# Patient Record
Sex: Female | Born: 1950 | Race: Black or African American | Hispanic: No | Smoking: Current every day smoker
Health system: Southern US, Community
[De-identification: ages and names within clinical notes are randomized; demographics above are authoritative.]

## PROBLEM LIST (undated history)

## (undated) DIAGNOSIS — E119 Type 2 diabetes mellitus without complications: Secondary | ICD-10-CM

## (undated) DIAGNOSIS — I10 Essential (primary) hypertension: Secondary | ICD-10-CM

## (undated) HISTORY — PX: ABDOMINAL HYSTERECTOMY: SHX81

---

## 1998-09-01 ENCOUNTER — Ambulatory Visit (HOSPITAL_COMMUNITY): Admission: RE | Admit: 1998-09-01 | Discharge: 1998-09-01 | Payer: Self-pay | Admitting: Family Medicine

## 1998-09-01 ENCOUNTER — Encounter: Payer: Self-pay | Admitting: Family Medicine

## 1998-09-21 ENCOUNTER — Other Ambulatory Visit: Admission: RE | Admit: 1998-09-21 | Discharge: 1998-09-21 | Payer: Self-pay | Admitting: Family Medicine

## 2000-01-11 ENCOUNTER — Emergency Department (HOSPITAL_COMMUNITY): Admission: EM | Admit: 2000-01-11 | Discharge: 2000-01-11 | Payer: Self-pay | Admitting: Emergency Medicine

## 2000-01-11 ENCOUNTER — Encounter: Payer: Self-pay | Admitting: Emergency Medicine

## 2001-09-15 ENCOUNTER — Encounter: Admission: RE | Admit: 2001-09-15 | Discharge: 2001-12-14 | Payer: Self-pay | Admitting: *Deleted

## 2002-04-19 ENCOUNTER — Encounter: Payer: Self-pay | Admitting: Emergency Medicine

## 2002-04-19 ENCOUNTER — Emergency Department (HOSPITAL_COMMUNITY): Admission: EM | Admit: 2002-04-19 | Discharge: 2002-04-19 | Payer: Self-pay | Admitting: Emergency Medicine

## 2003-06-04 ENCOUNTER — Inpatient Hospital Stay (HOSPITAL_COMMUNITY): Admission: EM | Admit: 2003-06-04 | Discharge: 2003-06-05 | Payer: Self-pay | Admitting: Emergency Medicine

## 2004-09-26 ENCOUNTER — Emergency Department (HOSPITAL_COMMUNITY): Admission: EM | Admit: 2004-09-26 | Discharge: 2004-09-26 | Payer: Self-pay | Admitting: Emergency Medicine

## 2004-10-17 ENCOUNTER — Encounter: Admission: RE | Admit: 2004-10-17 | Discharge: 2004-11-20 | Payer: Self-pay | Admitting: Occupational Medicine

## 2007-07-18 ENCOUNTER — Emergency Department (HOSPITAL_COMMUNITY): Admission: EM | Admit: 2007-07-18 | Discharge: 2007-07-18 | Payer: Self-pay | Admitting: Emergency Medicine

## 2009-04-15 ENCOUNTER — Ambulatory Visit (HOSPITAL_COMMUNITY): Admission: RE | Admit: 2009-04-15 | Discharge: 2009-04-15 | Payer: Self-pay | Admitting: Family Medicine

## 2009-04-28 ENCOUNTER — Encounter: Admission: RE | Admit: 2009-04-28 | Discharge: 2009-04-28 | Payer: Self-pay | Admitting: Family Medicine

## 2010-04-16 ENCOUNTER — Encounter: Payer: Self-pay | Admitting: Family Medicine

## 2010-08-11 NOTE — H&P (Signed)
Diane Garza, Diane Garza                          ACCOUNT NO.:  [id number]   MEDICAL RECORD NO.:  [id number]                   PATIENT TYPE:  EMS   LOCATION:  MAJO                                 FACILITY:  [facility]   PHYSICIAN:  Veneda Melter, M.D.                   DATE OF BIRTH:  28-Jun-1950   DATE OF ADMISSION:  06/04/2003  DATE OF DISCHARGE:                                HISTORY & PHYSICAL   CHIEF COMPLAINT:  Chest pain.   HISTORY:  Diane Garza is a 60-year-old black female with diabetes mellitus and  hypertension who presents with chest discomfort under her left breast.  The  patient has had intermittent episodes of pain for the last three weeks.  This may occur with rest or with exertion but did not appear to be  reproducible, does seem to be reduced some by rest.  She has not noted any  associated shortness of breath.  She did feel somewhat tired approximately  one week ago for one-half a day, felt better after she laid down.  She had  noted some radiation to the left shoulder.  This a.m., she had mid  epigastric discomfort with associated left chest pain and then radiation to  the left chest.  She denied any nausea, vomiting, diaphoresis, but this pain  was concerning enough that she decided to present to the emergency room.   Currently she is pain free after receiving sublingual nitroglycerin and  being started on nitroglycerin drip.  She denies any fevers, chills, or  coughs.  She has not had any orthopnea, paroxysmal nocturnal dyspnea.  She  has a history of lower extremity edema in the past, has been on Lasix p.r.n.  but has not needed any for the last eight months.  Review of Systems is  otherwise noncontributory.   PAST MEDICAL HISTORY:  1. Diabetes mellitus x 3 years.  2. Hypertension.  The patient was prescribed Lotrel but has not been taking     it regularly as she as been disappointed by the followup from her primary     care.  Primary care doctor also apparently had  increased her metformin to     2 tablets b.i.d., but she was reluctant to do so.  This is Dr. Lilian Kapur     in [facility].  She would like to see Dr. Parke Simmers, but apparently owes her     office money.  3. Hysterectomy in 1984 for cervical cancer.   ALLERGIES:  None.   MEDICATIONS:  1. Metformin 500 mg per day.  2. Lotrel which she is currently not taking.  3. Lasix p.r.n.,none x 8 months.   SOCIAL HISTORY:  The patient lives in [place].  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  She has a history of  tobacco use of one pack per day for 25 years.  Denies alcohol  use.   FAMILY HISTORY:  Mother died at age 69 with open cell lung cancer.  Father  died at age of 26 and had a history of gout.  Two sisters, one age 21 with  multiple sclerosis who recently suffered a myocardial infarction a month  ago.  Two brothers, one with hepatitis B.   PHYSICAL EXAMINATION:  GENERAL:  Well-developed, well-nourished black female  in no acute distress.  VITAL SIGNS:  Temperature 98.2, pulse 76, respirations 20, blood pressure  149/86, O2 saturation 98% on room air.  HEENT:  Pupils equal, round, and reactive to light.  Extraocular muscles are  intact.  Oropharynx shows no lesions.  NECK:  Supple, no adenopathy, no bruits.  HEART:  Regular rate and rhythm without murmurs.  LUNGS: Clear to auscultation.  ABDOMEN:  Soft, nontender.  EXTREMITIES:  No edema.  Peripheral pulses 2+ and equal bilaterally.  NEUROLOGIC:  Motor strength 5/5. Sensory intact to touch.   LABORATORY AND X-RAY DATA:  EKG shows normal sinus rhythm with nonspecific  ST-T wave changes in the anterior leads.  No acute ischemic changes.   Chest x-ray and laboratory data are pending.   IMPRESSION AND PLAN:  Diane Garza is a 60 year old female with poorly  controlled hypertension and diabetes mellitus who presents with a three-week  history of chest discomfort with radiation to the left shoulder.  The   patient has multiple cardiovascular risk factors including tobacco use,  diabetes, hypertension, and surgical menopause as well as family history.  The patient does not have acute EKG changes and does not have symptoms to  suggest upper airway infection, pulmonary embolus, or other chest pathology.  The differential at this point includes coronary artery disease, poorly  controlled hypertension, or peptic ulcer disease or gastritis.   We will plan on admitting the patient to the hospital for overnight  telemetry monitoring.  We will rule out acute myocardial infarction with  serial enzymes and plan to proceed with stress imaging study tomorrow.  We  will control her blood pressure with resumption of Lotrel and other  medications as necessary and add aspirin to her regimen.  I believe the  patient would benefit from statin therapy, but I am concerned that she my  not take this due to her history of medical noncompliance and expense.  I  will start her on proton pump inhibitor, check blood sugars and hemoglobin  A1C, and adjust her diabetes medications accordingly.  Should her stress  imaging study prove to be a low-risk study, will plan on discharging her and  treating with proton pump inhibitor for a minimum of six weeks at a time and  have her follow up with Dr. Parke Simmers.  Should this show any abnormalities or  should she have recurrence or persistence of discomfort, further invasive  assessment with cardiac catheterization may be indicated.  The patient  understands this treatment plan and agrees.                                                Veneda Melter, M.D.    NG/MEDQ  D:  06/04/2003  T:  06/05/2003  Job:  [id number]   cc:   Renaye Rakers, M.D.  [phone] [address]  [place]  [place] [address]  Fax: [phone]

## 2010-08-11 NOTE — Discharge Summary (Signed)
Diane Garza, Diane Garza                          ACCOUNT NO.:  [id number]   MEDICAL RECORD NO.:  [id number]                   PATIENT TYPE:  INP   LOCATION:  3713                                 FACILITY:  [facility]   PHYSICIAN:  Veneda Melter, M.D.                   DATE OF BIRTH:  [birth year]   DATE OF ADMISSION:  06/04/2003  DATE OF DISCHARGE:  06/05/2003                                 DISCHARGE SUMMARY   DISCHARGE DIAGNOSES:  1. Chest pain, etiology unclear.  2. Diabetes mellitus, type 2.  3. Hypertension.  4. Mild mixed dyslipidemia.  5. Medical noncompliance.   HOSPITAL COURSE:  Please see the admission history and physical by Dr. Veneda Melter on June 04, 2003 for complete details.  Briefly, this 60 year old  female presented to the emergency room with complaints of chest pain under  her left breast for 3 weeks, noted at rest or with exertion.  She also had  noted radiation to her left shoulder.  She was admitted, placed on aspirin,  proton pump inhibitor and her usual home medications for blood pressure.  She ruled out for myocardial infarction by enzymes.  She went for an  adenosine Cardiolite on June 05, 2003.  The Cardiolite revealed small fixed  perfusion defect in the inferolateral segment, extending down toward the  inferoapical region, no reversible defects.  There was a suggestion of very  subtle inferolateral/inferoapical hypokinesia.  LVEF was 66%.  This was felt  to be a low-risk scan.  Therefore, the patient was discharged to home in  stable condition on June 05, 2003.  She was counseled on diabetes  management.  She was discharged to home on her usual dose of Metformin.  She  was also asked to resume her Lotrel for blood pressure.  She planned on  seeing Dr. Renaye Rakers in followup.   LABORATORIES:  White count 6700, hemoglobin 13.9, hematocrit 42.8, platelet  count 275,000.  INR 0.9.  Sodium 136, potassium 3.7, chloride 104, CO2 26,  glucose 288, BUN 14, creatinine  0.8, calcium 9.2; total protein 6.3, albumin  3.5, AST 18, ALT 17, alkaline phosphatase 88, total bilirubin 0.7.  Hemoglobin A1c 10.8.  Cardiac enzymes negative x3.  Total cholesterol 148,  triglycerides 154, HDL 31, LDL 86.   DISCHARGE MEDICATIONS:  1. Metformin 500 mg daily.  2. Lotrel daily.   PAIN MANAGEMENT:  Tylenol as needed.   FOLLOWUP:  The patient is to follow up with Dr. Parke Simmers in [place] in the  next couple of weeks.      Tereso Newcomer, P.A.                        Veneda Melter, M.D.    SW/MEDQ  D:  06/21/2003  T:  06/21/2003  Job:  [id number]   cc:   Renaye Rakers, M.D.  [phone] [address],  [address]  [place]  [place] [address]  Fax: [phone]   Veneda Melter, M.D.

## 2010-12-19 LAB — DIFFERENTIAL
Basophils Absolute: 0.1
Basophils Relative: 1
Eosinophils Absolute: 0.1
Lymphocytes Relative: 23
Monocytes Absolute: 1.2 — ABNORMAL HIGH
Neutro Abs: 9.2 — ABNORMAL HIGH

## 2010-12-19 LAB — URINALYSIS, ROUTINE W REFLEX MICROSCOPIC
Glucose, UA: 100 — AB
Ketones, ur: 15 — AB
Nitrite: NEGATIVE
Protein, ur: 100 — AB
Specific Gravity, Urine: 1.027
Urobilinogen, UA: 0.2
pH: 5

## 2010-12-19 LAB — COMPREHENSIVE METABOLIC PANEL
ALT: 10
Albumin: 4.9
Alkaline Phosphatase: 89
CO2: 20
Creatinine, Ser: 2.38 — ABNORMAL HIGH
GFR calc non Af Amer: 21 — ABNORMAL LOW
Glucose, Bld: 281 — ABNORMAL HIGH
Potassium: 4
Sodium: 135
Total Protein: 8.3

## 2010-12-19 LAB — CBC
HCT: 52.5 — ABNORMAL HIGH
MCV: 83.2
Platelets: 273
RBC: 6.32 — ABNORMAL HIGH
RDW: 12.8

## 2010-12-19 LAB — URINE MICROSCOPIC-ADD ON

## 2010-12-19 LAB — LIPASE, BLOOD: Lipase: 20

## 2014-05-07 ENCOUNTER — Observation Stay (HOSPITAL_COMMUNITY)
Admission: EM | Admit: 2014-05-07 | Discharge: 2014-05-08 | Disposition: A | Discharge status: Home / Self Care | Payer: Self-pay | Attending: Internal Medicine | Admitting: Internal Medicine

## 2014-05-07 ENCOUNTER — Emergency Department (HOSPITAL_COMMUNITY): Payer: Self-pay

## 2014-05-07 ENCOUNTER — Encounter (HOSPITAL_COMMUNITY): Payer: Self-pay

## 2014-05-07 ENCOUNTER — Emergency Department (INDEPENDENT_AMBULATORY_CARE_PROVIDER_SITE_OTHER)
Admission: EM | Admit: 2014-05-07 | Discharge: 2014-05-07 | Disposition: A | Discharge status: Nursing Home (Includes ICF) | Payer: Self-pay | Source: Home / Self Care | Attending: Family Medicine | Admitting: Family Medicine

## 2014-05-07 ENCOUNTER — Encounter (HOSPITAL_COMMUNITY): Payer: Self-pay | Admitting: *Deleted

## 2014-05-07 DIAGNOSIS — E119 Type 2 diabetes mellitus without complications: Secondary | ICD-10-CM

## 2014-05-07 DIAGNOSIS — R51 Headache: Principal | ICD-10-CM | POA: Insufficient documentation

## 2014-05-07 DIAGNOSIS — R079 Chest pain, unspecified: Secondary | ICD-10-CM | POA: Diagnosis present

## 2014-05-07 DIAGNOSIS — R0789 Other chest pain: Secondary | ICD-10-CM

## 2014-05-07 DIAGNOSIS — Z720 Tobacco use: Secondary | ICD-10-CM | POA: Insufficient documentation

## 2014-05-07 DIAGNOSIS — Z79899 Other long term (current) drug therapy: Secondary | ICD-10-CM | POA: Insufficient documentation

## 2014-05-07 DIAGNOSIS — R519 Headache, unspecified: Secondary | ICD-10-CM | POA: Diagnosis present

## 2014-05-07 DIAGNOSIS — I10 Essential (primary) hypertension: Secondary | ICD-10-CM | POA: Diagnosis present

## 2014-05-07 HISTORY — DX: Essential (primary) hypertension: I10

## 2014-05-07 HISTORY — DX: Type 2 diabetes mellitus without complications: E11.9

## 2014-05-07 LAB — BASIC METABOLIC PANEL
ANION GAP: 8 (ref 5–15)
BUN: 10 mg/dL (ref 6–23)
CALCIUM: 9.9 mg/dL (ref 8.4–10.5)
CHLORIDE: 102 mmol/L (ref 96–112)
CO2: 27 mmol/L (ref 19–32)
Creatinine, Ser: 0.66 mg/dL (ref 0.50–1.10)
GFR calc Af Amer: >90 mL/min (ref >90)
GLUCOSE: 193 mg/dL — AB (ref 70–99)
POTASSIUM: 3.8 mmol/L (ref 3.5–5.1)
Sodium: 137 mmol/L (ref 135–145)

## 2014-05-07 LAB — CBC
HCT: 44 % (ref 36.0–46.0)
HEMOGLOBIN: 14.9 g/dL (ref 12.0–15.0)
MCH: 28 pg (ref 26.0–34.0)
MCHC: 33.9 g/dL (ref 30.0–36.0)
MCV: 82.6 fL (ref 78.0–100.0)
Platelets: 255 10*3/uL (ref 150–400)
RBC: 5.33 MIL/uL — AB (ref 3.87–5.11)
RDW: 13 % (ref 11.5–15.5)
WBC: 6.9 10*3/uL (ref 4.0–10.5)

## 2014-05-07 LAB — COMPREHENSIVE METABOLIC PANEL
ALK PHOS: 73 U/L (ref 39–117)
ALT: 11 U/L (ref 0–35)
AST: 15 U/L (ref 0–37)
Albumin: 4.2 g/dL (ref 3.5–5.2)
Anion gap: 10 (ref 5–15)
BILIRUBIN TOTAL: 0.9 mg/dL (ref 0.3–1.2)
BUN: 9 mg/dL (ref 6–23)
CHLORIDE: 102 mmol/L (ref 96–112)
CO2: 26 mmol/L (ref 19–32)
Calcium: 9.8 mg/dL (ref 8.4–10.5)
Creatinine, Ser: 0.8 mg/dL (ref 0.50–1.10)
GFR, EST NON AFRICAN AMERICAN: 78 mL/min — AB (ref >90)
GLUCOSE: 189 mg/dL — AB (ref 70–99)
POTASSIUM: 3.8 mmol/L (ref 3.5–5.1)
Sodium: 138 mmol/L (ref 135–145)
Total Protein: 7.4 g/dL (ref 6.0–8.3)

## 2014-05-07 LAB — URINALYSIS, ROUTINE W REFLEX MICROSCOPIC
BILIRUBIN URINE: NEGATIVE
Glucose, UA: 500 mg/dL — AB
HGB URINE DIPSTICK: NEGATIVE
KETONES UR: NEGATIVE mg/dL
Leukocytes, UA: NEGATIVE
Nitrite: NEGATIVE
PROTEIN: NEGATIVE mg/dL
SPECIFIC GRAVITY, URINE: 1.01 (ref 1.005–1.030)
UROBILINOGEN UA: 0.2 mg/dL (ref 0.0–1.0)
pH: 5 (ref 5.0–8.0)

## 2014-05-07 LAB — GLUCOSE, CAPILLARY
Glucose-Capillary: 180 mg/dL — ABNORMAL HIGH (ref 70–99)
Glucose-Capillary: 297 mg/dL — ABNORMAL HIGH (ref 70–99)

## 2014-05-07 LAB — TROPONIN I
Troponin I: <0.03 ng/mL (ref <0.031)
Troponin I: <0.03 ng/mL (ref <0.031)

## 2014-05-07 LAB — D-DIMER, QUANTITATIVE (NOT AT ARMC): D-Dimer, Quant: 0.32 ug/mL-FEU (ref 0.00–0.48)

## 2014-05-07 MED ORDER — MORPHINE SULFATE 4 MG/ML IJ SOLN
4.0000 mg | Freq: Once | INTRAMUSCULAR | Status: AC
  Administered 2014-05-07: 4 mg via INTRAVENOUS
  Filled 2014-05-07: qty 1

## 2014-05-07 MED ORDER — SODIUM CHLORIDE 0.9 % IV SOLN
INTRAVENOUS | Status: DC
  Administered 2014-05-07: 20:00:00 via INTRAVENOUS

## 2014-05-07 MED ORDER — PANTOPRAZOLE SODIUM 40 MG IV SOLR
40.0000 mg | Freq: Once | INTRAVENOUS | Status: AC
  Administered 2014-05-07: 40 mg via INTRAVENOUS
  Filled 2014-05-07: qty 40

## 2014-05-07 MED ORDER — POTASSIUM CHLORIDE CRYS ER 20 MEQ PO TBCR
20.0000 meq | EXTENDED_RELEASE_TABLET | Freq: Every day | ORAL | Status: DC
  Administered 2014-05-07 – 2014-05-08 (×2): 20 meq via ORAL
  Filled 2014-05-07 (×2): qty 1

## 2014-05-07 MED ORDER — LABETALOL HCL 5 MG/ML IV SOLN
20.0000 mg | Freq: Once | INTRAVENOUS | Status: AC
  Administered 2014-05-07: 20 mg via INTRAVENOUS
  Filled 2014-05-07: qty 4

## 2014-05-07 MED ORDER — ONDANSETRON HCL 4 MG/2ML IJ SOLN: 4.0000 mg | Freq: Four times a day (QID) | INTRAMUSCULAR | Status: DC | PRN

## 2014-05-07 MED ORDER — HYDROCHLOROTHIAZIDE 12.5 MG PO CAPS
12.5000 mg | ORAL_CAPSULE | Freq: Every day | ORAL | Status: DC
  Administered 2014-05-07 – 2014-05-08 (×2): 12.5 mg via ORAL
  Filled 2014-05-07 (×2): qty 1

## 2014-05-07 MED ORDER — GI COCKTAIL ~~LOC~~
30.0000 mL | Freq: Once | ORAL | Status: AC
  Administered 2014-05-07: 30 mL via ORAL

## 2014-05-07 MED ORDER — NITROGLYCERIN 0.4 MG SL SUBL
0.4000 mg | SUBLINGUAL_TABLET | SUBLINGUAL | Status: DC | PRN
  Filled 2014-05-07: qty 1

## 2014-05-07 MED ORDER — ACETAMINOPHEN 325 MG PO TABS
650.0000 mg | ORAL_TABLET | ORAL | Status: DC | PRN
  Administered 2014-05-07 – 2014-05-08 (×2): 650 mg via ORAL
  Filled 2014-05-07 (×2): qty 2

## 2014-05-07 MED ORDER — INSULIN ASPART 100 UNIT/ML ~~LOC~~ SOLN
0.0000 [IU] | Freq: Three times a day (TID) | SUBCUTANEOUS | Status: DC
  Administered 2014-05-08: 5 [IU] via SUBCUTANEOUS
  Administered 2014-05-08: 2 [IU] via SUBCUTANEOUS

## 2014-05-07 MED ORDER — AMLODIPINE BESYLATE 5 MG PO TABS
5.0000 mg | ORAL_TABLET | Freq: Every day | ORAL | Status: DC
  Administered 2014-05-07 – 2014-05-08 (×2): 5 mg via ORAL
  Filled 2014-05-07: qty 1

## 2014-05-07 MED ORDER — HYDRALAZINE HCL 20 MG/ML IJ SOLN: 10.0000 mg | Freq: Four times a day (QID) | INTRAMUSCULAR | Status: DC | PRN

## 2014-05-07 MED ORDER — GI COCKTAIL ~~LOC~~
ORAL | Status: AC
  Filled 2014-05-07: qty 30

## 2014-05-07 MED ORDER — ENOXAPARIN SODIUM 40 MG/0.4ML ~~LOC~~ SOLN
40.0000 mg | SUBCUTANEOUS | Status: DC
  Administered 2014-05-07: 40 mg via SUBCUTANEOUS
  Filled 2014-05-07: qty 0.4

## 2014-05-07 MED ORDER — GI COCKTAIL ~~LOC~~
30.0000 mL | Freq: Once | ORAL | Status: DC
  Filled 2014-05-07: qty 30

## 2014-05-07 MED ORDER — ASPIRIN EC 325 MG PO TBEC
325.0000 mg | DELAYED_RELEASE_TABLET | Freq: Every day | ORAL | Status: DC
  Administered 2014-05-08: 325 mg via ORAL
  Filled 2014-05-07: qty 1

## 2014-05-07 MED ORDER — ASPIRIN 81 MG PO CHEW
324.0000 mg | CHEWABLE_TABLET | Freq: Once | ORAL | Status: AC
  Administered 2014-05-07: 324 mg via ORAL
  Filled 2014-05-07: qty 4

## 2014-05-07 MED ORDER — KETOROLAC TROMETHAMINE 30 MG/ML IJ SOLN: 30.0000 mg | Freq: Four times a day (QID) | INTRAMUSCULAR | Status: DC | PRN

## 2014-05-07 MED ORDER — MORPHINE SULFATE 2 MG/ML IJ SOLN: 1.0000 mg | INTRAMUSCULAR | Status: DC | PRN

## 2014-05-07 MED ORDER — GI COCKTAIL ~~LOC~~: 30.0000 mL | Freq: Four times a day (QID) | ORAL | Status: DC | PRN

## 2014-05-07 MED ORDER — PANTOPRAZOLE SODIUM 40 MG PO TBEC
40.0000 mg | DELAYED_RELEASE_TABLET | Freq: Every day | ORAL | Status: DC
  Administered 2014-05-08: 40 mg via ORAL
  Filled 2014-05-07: qty 1

## 2014-05-07 NOTE — ED Notes (Addendum)
Pt      Reports      Chest  Pain    And  Headache         The  Symptoms         X     3  Days        Has  Had        Indigestion  In  Past  As  Well

## 2014-05-07 NOTE — ED Notes (Signed)
Centralized chest pain that woke patient up from sleep Monday night. Comes and goes since. Persistent headache. This AM patient states she felt weak.

## 2014-05-07 NOTE — ED Provider Notes (Signed)
Medical screening examination/treatment/procedure(s) were conducted as a shared visit with non-physician practitioner(s) and myself.  I personally evaluated the patient during the encounter.   EKG Interpretation   Date/Time:  Friday May 07 2014 09:48:47 EST Ventricular Rate:  72 PR Interval:  157 QRS Duration: 58 QT Interval:  362 QTC Calculation: 396 R Axis:   47 Text Interpretation:  Sinus rhythm Ventricular premature complex Probable  anteroseptal infarct, old Artifact No significant change was found  Confirmed by Manus GunningANCOUR  MD, Diane Garza [phone]) on 05/07/2014 10:07:12 AM       HPI Comments: Diane Garza is a 64 y.o. female with past medical history of HTN, DM who presents to the Emergency Department complaining of 3-4 days of a gradual onset, constant, frontal HA. Patient states the HA woke her from sleep after which she noticed chest pain. She states the chest pain is constant, non-radiating and is relieved by belching after drinking carbonated beverages. She denies history of similar symptoms in the past. She denies photophobia, phonophobia, diaphoresis, SOB, lightheadedness. She denies history of a stress test.   Physical Examination:  General appearance - alert, well appearing, and in no distress and oriented to person, place, and time Mental status - alert, oriented to person, place, and time, normal mood, behavior, speech, dress, motor activity, and thought processes Neck - no meningismus  Chest - clear to auscultation, no wheezes, rales or rhonchi, symmetric air entry, no chest wall tenderness Heart - normal rate, regular rhythm  Uncontrolled blood pressure with 4 days ago gradual onset diffuse headache, central chest pain that is constant and does not radiate. EKG normal sinus rhythm. Nonfocal neuro exam  This chart was scribed for Diane OctaveStephen Joseandres Mazer, MD by Leone PayorSonum Garza, ED Scribe. This patient was seen in room D33C/D33C and the patient's care was started 11:06 AM.    Diane OctaveStephen Merari Pion, MD 05/07/14 1155

## 2014-05-07 NOTE — H&P (Signed)
PATIENT DETAILS Name: Diane Garza Age: 64 y.o. Sex: female Date of Birth: [birth year] Admit Date: 05/07/2014 PCP:No primary care provider on file.   CHIEF COMPLAINT:  Chest pain since this past Tuesday  HPI: Diane Garza is a 64 y.o. female with a Past Medical History of hypertension (noncompliant to HCTZ-ran out 3 weeks back) diabetes, tobacco abuse who presents today with the above noted complaint. Per patient, since this past Tuesday she is felt pressure-like sensation in the center for chest. Pain is around 4/10 at its worse, without any nausea, vomiting or diarrhea. There is no radiation of the pain. Because of persistent pain she presented to the emergency room for further evaluation and treatment. She also complains of worsening headache in the bitemporal area. There is no history of fever, photophobia or neck pain. In the ED, she was found to have a significantly elevated blood pressure, given IV labetalol. Initial troponins and EKG were negative, I was asked to admit this patient for further evaluation and treatment. During the evaluation, patient is awake alert, she denied any chest pain, headache was significantly better and she rated it around 3/10.  ALLERGIES:  No Known Allergies  PAST MEDICAL HISTORY: Past Medical History  Diagnosis Date  . Hypertension   . Diabetes mellitus without complication     PAST SURGICAL HISTORY: Past Surgical History  Procedure Laterality Date  . Abdominal hysterectomy      MEDICATIONS AT HOME: Prior to Admission medications   Medication Sig Start Date End Date Taking? Authorizing Provider  acetaminophen (TYLENOL) 325 MG tablet Take 650 mg by mouth every 6 (six) hours as needed for mild pain, moderate pain or headache.   Yes Historical Provider, MD  hydrochlorothiazide (MICROZIDE) 12.5 MG capsule Take 12.5 mg by mouth daily.   Yes Historical Provider, MD  Ibuprofen-Diphenhydramine HCl 200-25 MG CAPS Take 1 tablet by mouth  daily as needed (headache).   Yes Historical Provider, MD  metFORMIN (GLUCOPHAGE) 500 MG tablet Take 1,000 mg by mouth 2 (two) times daily with a meal.   Yes Historical Provider, MD    FAMILY HISTORY: Mother-lung cancer No history of CAD  SOCIAL HISTORY:  reports that she has been smoking.  She does not have any smokeless tobacco history on file. She reports that she does not drink alcohol or use illicit drugs.  REVIEW OF SYSTEMS:  Constitutional:   No  weight loss, night sweats,  Fevers, chills, fatigue.  HEENT:    No headaches, Difficulty swallowing,Tooth/dental problems,Sore throat,   Cardio-vascular: No chest pain,  Orthopnea, PND, swelling in lower extremities, anasarca  GI:  No heartburn, indigestion, abdominal pain, nausea, vomiting, diarrhea  Resp: No shortness of breath with exertion or at rest.  No excess mucus, no productive cough, No non-productive cough  Skin:  no rash or lesions.  GU:  no dysuria, change in color of urine, no urgency or frequency.  No flank pain.  Musculoskeletal: No joint pain or swelling.  No decreased range of motion.  No back pain.  Psych: No change in mood or affect. No depression or anxiety.  No memory loss.   PHYSICAL EXAM: Blood pressure 125/55, pulse 62, temperature 98.4 F (36.9 C), temperature source Oral, resp. rate 16, SpO2 98 %.  General appearance :Awake, alert, not in any distress. Speech Clear. Not toxic Looking HEENT: Atraumatic and Normocephalic, pupils equally reactive to light and accomodation Neck: supple, no JVD. No cervical lymphadenopathy.  Chest:Good air entry  bilaterally, no added sounds  CVS: S1 S2 regular, no murmurs.  Abdomen: Bowel sounds present, Non tender and not distended with no gaurding, rigidity or rebound. Extremities: B/L Lower Ext shows no edema, both legs are warm to touch Neurology: Awake alert, and oriented X 3, CN II-XII intact, Non focal Skin:No Rash Wounds:N/A  LABS ON ADMISSION:    Recent Labs  05/07/14 0954  NA 138  137  K 3.8  3.8  CL 102  102  CO2 26  27  GLUCOSE 189*  193*  BUN 9  10  CREATININE 0.80  0.66  CALCIUM 9.8  9.9    Recent Labs  05/07/14 0954  AST 15  ALT 11  ALKPHOS 73  BILITOT 0.9  PROT 7.4  ALBUMIN 4.2   No results for input(s): LIPASE, AMYLASE in the last 72 hours.  Recent Labs  05/07/14 0954  WBC 6.9  HGB 14.9  HCT 44.0  MCV 82.6  PLT 255    Recent Labs  05/07/14 0954  TROPONINI <0.03    Recent Labs  05/07/14 0954  DDIMER 0.32   Invalid input(s): POCBNP   RADIOLOGIC STUDIES ON ADMISSION: Ct Head Wo Contrast  05/07/2014   CLINICAL DATA:  64 year old with headache since Monday. History of hypertension.  EXAM: CT HEAD WITHOUT CONTRAST  TECHNIQUE: Contiguous axial images were obtained from the base of the skull through the vertex without contrast.  COMPARISON:  None  FINDINGS: Normal appearance of the intracranial structures. No evidence for acute hemorrhage, mass lesion, midline shift, hydrocephalus or large infarct. No acute bony abnormality. The visualized sinuses are clear.  IMPRESSION: No acute intracranial abnormality.   Electronically Signed   By: Richarda OverlieAdam  Henn M.D.   On: 05/07/2014 11:18   Dg Chest Port 1 View  05/07/2014   CLINICAL DATA:  Mid chest pain and pressure with headache  EXAM: PORTABLE CHEST - 1 VIEW  COMPARISON:  None currently available  FINDINGS: Normal heart size and mediastinal contours. Mild hyperinflation. No acute infiltrate or edema. No effusion or pneumothorax. No acute osseous findings.  IMPRESSION: No active disease.   Electronically Signed   By: Marnee SpringJonathon  Watts M.D.   On: 05/07/2014 10:30    EKG: Independently reviewed. NSR  ASSESSMENT AND PLAN: Present on Admission:  . Headache: Suspect secondary to uncontrolled hypertension. Headache significantly improved. The emergency room, will try to control blood pressure better. If headache persists, consider further workup.   .  Chest pain: Mostly atypical features. She does have history of diabetes, hypertension and tobacco abuse, she will be admitted to telemetry unit, cardiac enzymes were cycled. She will be placed on aspirin, and we'll check an echocardiogram to assess for wall motion abnormality. If her cardiac enzymes become positive, we will need to consult cardiology.   . Hypertensive urgency: Resume HCTZ and start low-dose amlodipine. Uncontrolled hypertension secondary to noncompliance-mostly due to financial issues  . Type 2 diabetes:hold metformin for now, place on SSI and follow CBG's.  Further plan will depend as patient's clinical course evolves and further radiologic and laboratory data become available. Patient will be monitored closely.  Above noted plan was discussed with patient, she was in agreement.   DVT Prophylaxis: Prophylactic Lovenox   Code Status: Full Code  Disposition Plan: Home in 1-2 days  Total time spent for admission equals 45 minutes.  [facility] HealthGHIMIRE,SHANKER [facility] Pager [phone]  If 7PM-7AM, please contact night-coverage [email or web address] Password [email or web address] 05/07/2014, 3:24 PM

## 2014-05-07 NOTE — ED Notes (Signed)
Admitting physician bedside. 

## 2014-05-07 NOTE — ED Notes (Signed)
Joe, PA back at the bedside.

## 2014-05-07 NOTE — ED Provider Notes (Signed)
CSN: [id number]     Arrival date & time 05/07/14  0831 History   First MD Initiated Contact with Patient 05/07/14 [phone]     Chief Complaint  Patient presents with  . Chest Pain   (Consider location/radiation/quality/duration/timing/severity/associated sxs/prior Treatment) Patient is a 64 y.o. female presenting with chest pain. The history is provided by the patient.  Chest Pain Pain location:  Substernal area and epigastric Pain quality: pressure   Pain radiates to:  Does not radiate Pain radiates to the back: no   Pain severity:  Mild Onset quality:  Sudden Duration:  3 days Progression:  Waxing and waning Chronicity:  New Relieved by: soda caused belching. Associated symptoms: no abdominal pain, no diaphoresis, no dysphagia, no nausea, no shortness of breath and not vomiting   Risk factors: diabetes mellitus and hypertension     Past Medical History  Diagnosis Date  . Hypertension   . Diabetes mellitus without complication    No past surgical history on file. History reviewed. No pertinent family history. History  Substance Use Topics  . Smoking status: Current Every Day Smoker  . Smokeless tobacco: Not on file  . Alcohol Use: No   OB History    No data available     Review of Systems  Constitutional: Negative.  Negative for diaphoresis.  HENT: Negative for trouble swallowing.   Respiratory: Negative for shortness of breath.   Cardiovascular: Positive for chest pain. Negative for leg swelling.  Gastrointestinal: Negative.  Negative for nausea, vomiting and abdominal pain.  Genitourinary: Negative.     Allergies  Review of patient's allergies indicates no known allergies.  Home Medications   Prior to Admission medications   Medication Sig Start Date End Date Taking? Authorizing Provider  hydrochlorothiazide (MICROZIDE) 12.5 MG capsule Take 12.5 mg by mouth daily.   Yes Historical Provider, MD  metFORMIN (GLUCOPHAGE) 500 MG tablet Take 1,000 mg by mouth 2  (two) times daily with a meal.   Yes Historical Provider, MD   BP 184/97 mmHg  Pulse 85  Temp(Src) 98.5 F (36.9 C) (Oral)  Resp 16  SpO2 98% Physical Exam  Constitutional: She is oriented to person, place, and time. She appears well-developed and well-nourished. No distress.  HENT:  Mouth/Throat: Oropharynx is clear and moist.  Eyes: Pupils are equal, round, and reactive to light.  Neck: Normal range of motion. Neck supple.  Cardiovascular: Normal rate, normal heart sounds and intact distal pulses.   Pulmonary/Chest: Effort normal and breath sounds normal.  Abdominal: Soft. Bowel sounds are normal. There is no tenderness.  Lymphadenopathy:    She has no cervical adenopathy.  Neurological: She is alert and oriented to person, place, and time.  Skin: Skin is warm and dry.  Nursing note and vitals reviewed.   ED Course  Procedures (including critical care time) Labs Review Labs Reviewed - No data to display ecg wnl  Imaging Review No results found.   MDM   1. Atypical chest pain    Sent for eval of chest pressure, relieved on tues briefly with belching but recurred today assoc with new headache, persistent.and hypertension., diabetes risk factors.    Linna HoffJames D Kindl, MD 05/07/14 [phone]

## 2014-05-07 NOTE — ED Provider Notes (Signed)
CSN: [id number]     Arrival date & time 05/07/14  9147 History   First MD Initiated Contact with Patient 05/07/14 [phone]     Chief Complaint  Patient presents with  . Headache  . Chest Pain     (Consider location/radiation/quality/duration/timing/severity/associated sxs/prior Treatment) HPI Zosia Lucchese is a 64 year old female with past medical history of hypertension, diabetes who presents the ER complaining of headache and chest pain. Patient reports her headache began gradually earlier in the week, and has persisted since Monday. Patient also reports intermittent chest pressure substernally throughout the week as well. Patient describes her headache as being bilateral temporal region, constant, sharp headache. Patient denies associated blurred vision, dizziness, weakness, nausea, vomiting. Patient describes her chest discomfort as a centralized chest pressure which does not radiate. Patient denies having any aggravating factors. Patient reports having alleviation of her chest discomfort with belching. Patient denies associated shortness of breath, lightheadedness, nausea, vomiting, abdominal pain, fever, cough.  Past Medical History  Diagnosis Date  . Hypertension   . Diabetes mellitus without complication    Past Surgical History  Procedure Laterality Date  . Abdominal hysterectomy     No family history on file. History  Substance Use Topics  . Smoking status: Current Every Day Smoker  . Smokeless tobacco: Not on file  . Alcohol Use: No   OB History    No data available     Review of Systems  Constitutional: Negative for fever.  HENT: Negative for trouble swallowing.   Eyes: Negative for visual disturbance.  Respiratory: Negative for shortness of breath.   Cardiovascular: Positive for chest pain.  Gastrointestinal: Negative for nausea, vomiting and abdominal pain.  Genitourinary: Negative for dysuria.  Musculoskeletal: Negative for neck pain.  Skin: Negative for rash.   Neurological: Positive for headaches. Negative for dizziness, weakness and numbness.  Psychiatric/Behavioral: Negative.       Allergies  Review of patient's allergies indicates no known allergies.  Home Medications   Prior to Admission medications   Medication Sig Start Date End Date Taking? Authorizing Provider  acetaminophen (TYLENOL) 325 MG tablet Take 650 mg by mouth every 6 (six) hours as needed for mild pain, moderate pain or headache.   Yes Historical Provider, MD  hydrochlorothiazide (MICROZIDE) 12.5 MG capsule Take 12.5 mg by mouth daily.   Yes Historical Provider, MD  Ibuprofen-Diphenhydramine HCl 200-25 MG CAPS Take 1 tablet by mouth daily as needed (headache).   Yes Historical Provider, MD  metFORMIN (GLUCOPHAGE) 500 MG tablet Take 1,000 mg by mouth 2 (two) times daily with a meal.   Yes Historical Provider, MD   BP 177/92 mmHg  Pulse 82  Temp(Src) 98.4 F (36.9 C) (Oral)  Resp 21  SpO2 100% Physical Exam  Constitutional: She is oriented to person, place, and time. She appears well-developed and well-nourished. No distress.  HENT:  Head: Normocephalic and atraumatic.  Mouth/Throat: Oropharynx is clear and moist. No oropharyngeal exudate.  Eyes: Right eye exhibits no discharge. Left eye exhibits no discharge. No scleral icterus.  Neck: Normal range of motion.  Cardiovascular: Normal rate, regular rhythm, S1 normal, S2 normal and normal heart sounds.   No murmur heard. Pulses:      Radial pulses are 2+ on the right side, and 2+ on the left side.  Pulmonary/Chest: Effort normal and breath sounds normal. No accessory muscle usage. No tachypnea. No respiratory distress.  Abdominal: Soft. Normal appearance and bowel sounds are normal. There is no tenderness. There is no rigidity, no  guarding, no tenderness at McBurney's point and negative Murphy's sign.  Musculoskeletal: Normal range of motion. She exhibits no edema or tenderness.  Neurological: She is alert and oriented  to person, place, and time. She has normal strength. No cranial nerve deficit or sensory deficit. Coordination normal.  Patient is fully alert, answering questions appropriately in full, clear sentences. Cranial nerves II through XII grossly intact. Motor strength 5 out of 5 in all major muscle groups of upper and lower extremities. Distal sensation intact.  Skin: Skin is warm and dry. No rash noted. She is not diaphoretic.  Psychiatric: She has a normal mood and affect.  Nursing note and vitals reviewed.   ED Course  Procedures (including critical care time) Labs Review Labs Reviewed  CBC - Abnormal; Notable for the following:    RBC 5.33 (*)    All other components within normal limits  BASIC METABOLIC PANEL - Abnormal; Notable for the following:    Glucose, Bld 193 (*)    All other components within normal limits  COMPREHENSIVE METABOLIC PANEL - Abnormal; Notable for the following:    Glucose, Bld 189 (*)    GFR calc non Af Amer 78 (*)    All other components within normal limits  TROPONIN I  D-DIMER, QUANTITATIVE  URINALYSIS, ROUTINE W REFLEX MICROSCOPIC    Imaging Review Ct Head Wo Contrast  05/07/2014   CLINICAL DATA:  64 year old with headache since Monday. History of hypertension.  EXAM: CT HEAD WITHOUT CONTRAST  TECHNIQUE: Contiguous axial images were obtained from the base of the skull through the vertex without contrast.  COMPARISON:  None  FINDINGS: Normal appearance of the intracranial structures. No evidence for acute hemorrhage, mass lesion, midline shift, hydrocephalus or large infarct. No acute bony abnormality. The visualized sinuses are clear.  IMPRESSION: No acute intracranial abnormality.   Electronically Signed   By: Richarda Overlie M.D.   On: 05/07/2014 11:18   Dg Chest Port 1 View  05/07/2014   CLINICAL DATA:  Mid chest pain and pressure with headache  EXAM: PORTABLE CHEST - 1 VIEW  COMPARISON:  None currently available  FINDINGS: Normal heart size and mediastinal  contours. Mild hyperinflation. No acute infiltrate or edema. No effusion or pneumothorax. No acute osseous findings.  IMPRESSION: No active disease.   Electronically Signed   By: Marnee Spring M.D.   On: 05/07/2014 10:30     EKG Interpretation   Date/Time:  Friday May 07 2014 09:48:47 EST Ventricular Rate:  72 PR Interval:  157 QRS Duration: 58 QT Interval:  362 QTC Calculation: 396 R Axis:   47 Text Interpretation:  Sinus rhythm Ventricular premature complex Probable  anteroseptal infarct, old Artifact No significant change was found  Confirmed by Manus Gunning  MD, STEPHEN [phone]) on 05/07/2014 10:07:12 AM      MDM   Final diagnoses:  Chest discomfort  Headache   Patient here complaining of chest pain and headache ongoing for 5 days. Patient reports not having history of headaches in the past, was followed up with CT head without contrast which did not show any evidence of acute pathology.patient's chest pain has been intermittent throughout the week, troponin here today was negative which makes ACS less likely with ongoing chest pain for 5 days and negative troponin here. EKG without evidence of acute injury or ectopy.Patient's chest pain is nonspecific, and atypical for ACS, however with patient consistently hypertensive and with a continuous headache for 5 days, there is concern for a hypertensive emergency.Patient  given IV labetalol which improved patient's blood pressure. Patient's symptoms resolving after blood pressure begins to improve.chest radiograph unremarkable for acute pathology. Other labs reassuring at this point. D-dimer at 0.32, no concern for PE. Patient to be admitted to observation for hypertensive emergency and ACS rule out. The patient appears reasonably stabilized for admission considering the current resources, flow, and capabilities available in the ED at this time, and I doubt any other [facility] requiring further screening and/or treatment in the ED prior to  admission.  BP 177/92 mmHg  Pulse 82  Temp(Src) 98.4 F (36.9 C) (Oral)  Resp 21  SpO2 100%  Signed,  Ladona MowJoe Christabell Loseke, PA-C 4:16 PM  Patient seen and discussed with Dr. Glynn OctaveStephen Rancour, MD    Monte FantasiaJoseph W Evalynn Hankins, PA-C 05/07/14 1617  Glynn OctaveStephen Rancour, MD 05/07/14 [phone]

## 2014-05-08 ENCOUNTER — Observation Stay (HOSPITAL_COMMUNITY): Payer: Self-pay

## 2014-05-08 ENCOUNTER — Other Ambulatory Visit: Payer: Self-pay | Admitting: Physician Assistant

## 2014-05-08 DIAGNOSIS — R079 Chest pain, unspecified: Secondary | ICD-10-CM

## 2014-05-08 DIAGNOSIS — R51 Headache: Principal | ICD-10-CM

## 2014-05-08 LAB — GLUCOSE, CAPILLARY
GLUCOSE-CAPILLARY: 171 mg/dL — AB (ref 70–99)
Glucose-Capillary: 181 mg/dL — ABNORMAL HIGH (ref 70–99)
Glucose-Capillary: 262 mg/dL — ABNORMAL HIGH (ref 70–99)

## 2014-05-08 LAB — TROPONIN I

## 2014-05-08 MED ORDER — ASPIRIN 325 MG PO TBEC: 325.0000 mg | DELAYED_RELEASE_TABLET | Freq: Every day | ORAL | Status: DC

## 2014-05-08 MED ORDER — REGADENOSON 0.4 MG/5ML IV SOLN
0.4000 mg | Freq: Once | INTRAVENOUS | Status: AC
  Administered 2014-05-08: 0.4 mg via INTRAVENOUS
  Filled 2014-05-08: qty 5

## 2014-05-08 MED ORDER — TECHNETIUM TC 99M SESTAMIBI GENERIC - CARDIOLITE
10.0000 | Freq: Once | INTRAVENOUS | Status: AC | PRN
  Administered 2014-05-08: 10 via INTRAVENOUS

## 2014-05-08 MED ORDER — AMLODIPINE BESYLATE 5 MG PO TABS: 5.0000 mg | ORAL_TABLET | Freq: Every day | ORAL | Status: AC

## 2014-05-08 MED ORDER — TECHNETIUM TC 99M SESTAMIBI GENERIC - CARDIOLITE
30.0000 | Freq: Once | INTRAVENOUS | Status: AC | PRN
  Administered 2014-05-08: 30 via INTRAVENOUS

## 2014-05-08 MED ORDER — REGADENOSON 0.4 MG/5ML IV SOLN
INTRAVENOUS | Status: AC
  Filled 2014-05-08: qty 5

## 2014-05-08 NOTE — Progress Notes (Signed)
Dr. Catha GosselinMikhail reports she spoke with Dr. Elease HashimotoNahser regarding this patient. Nuc normal. She requests outpatient echo and appt. I have left a message on our office's scheduling voicemail requesting this and our office will call the patient with this information. Samiksha Pellicano PA-C

## 2014-05-08 NOTE — Progress Notes (Signed)
[facility]                                                                              Patient Demographics  Diane Garza, is a 64 y.o. female, DOB - 1950/04/09, ZOX:[id number]  Admit date - 05/07/2014   Admitting Physician Dewayne ShorterShanker Levora DredgeM Ghimire, MD  Outpatient Primary MD for the patient is No primary care provider on file.  LOS -    Chief Complaint  Patient presents with  . Headache  . Chest Pain      HPI on 05/07/2014 by Dr. Jeoffrey MassedShanker Ghimire Diane Garza is a 64 y.o. female with a Past Medical History of hypertension (noncompliant to HCTZ-ran out 3 weeks back) diabetes, tobacco abuse who presents today with the above noted complaint. Per patient, since this past Tuesday she is felt pressure-like sensation in the center for chest. Pain is around 4/10 at its worse, without any nausea, vomiting or diarrhea. There is no radiation of the pain. Because of persistent pain she presented to the emergency room for further evaluation and treatment. She also complains of worsening headache in the bitemporal area. There is no history of fever, photophobia or neck pain. In the ED, she was found to have a significantly elevated blood pressure, given IV labetalol. Initial troponins and EKG were negative, I was asked to admit this patient for further evaluation and treatment. During the evaluation, patient is awake alert, she denied any chest pain, headache was significantly better and she rated it around 3/10.  Assessment & Plan   Chest pain, with atypical features -Will obtain echocardiogram and Myoview -Troponins have been cycled and negative -Possibly secondary to uncontrolled hypertension -Patient states she is currently chest pain-free, has no shortness of breath -Pending results of Myoview and echocardiogram may consult cardiology -Continue aspirin -Patient had negative d-dimer, 0.32 -Will obtain lipid panel  Accelerated hypertension -Patient has history of noncompliance due to  financial issues -Continue amlodipine and HCTZ -Patient will need to follow-up with her primary care physician  Headaches -Likely secondary to uncontrolled hypertension -Patient states her headache is improved  Diabetes mellitus, type 2 -Hemoglobin A1c pending -Metformin currently held -Continue insulin sliding scale with CBG monitoring  Code Status: Full  Family Communication: None at bedside  Disposition Plan: Admitted  Time Spent in minutes   30 minutes  Procedures  Myoview Echocardiogram  Consults   Cardiology, via phone  DVT Prophylaxis  Lovenox  Lab Results  Component Value Date   PLT 255 05/07/2014    Medications  Scheduled Meds: . amLODipine  5 mg Oral Daily  . aspirin EC  325 mg Oral Daily  . enoxaparin (LOVENOX) injection  40 mg Subcutaneous Q24H  . hydrochlorothiazide  12.5 mg Oral Daily  . insulin aspart  0-9 Units Subcutaneous TID WC  . pantoprazole  40 mg Oral Q1200  . potassium chloride  20 mEq Oral Daily   Continuous Infusions: . sodium chloride 10 mL/hr at 05/07/14 2016   PRN Meds:.acetaminophen, gi cocktail, hydrALAZINE, ketorolac, morphine injection, ondansetron (ZOFRAN) IV  Antibiotics    Anti-infectives    None        Subjective:   Diane Garza seen and examined today.  Patient denies any chest pressure, or chest pain at this time. Feels her headache has improved. Denies any abdominal pain, dizziness. Patient wishes to go home.  Objective:   Filed Vitals:   05/08/14 1207 05/08/14 1209 05/08/14 1211 05/08/14 1324  BP: 188/78 151/66 145/68 149/86  Pulse: 110 95 100 103  Temp:    98.2 F (36.8 C)  TempSrc:    Oral  Resp:    20  Height:      Weight:      SpO2:    96%    Wt Readings from Last 3 Encounters:  05/08/14 64.048 kg (141 lb 3.2 oz)     Intake/Output Summary (Last 24 hours) at 05/08/14 1355 Last data filed at 05/08/14 1300  Gross per 24 hour  Intake    360 ml  Output      0 ml  Net    360 ml     Exam  General: Well developed, well nourished, NAD, appears stated age  HEENT: NCAT, mucous membranes moist.   Cardiovascular: S1 S2 auscultated, no rubs, murmurs or gallops. Regular rate and rhythm.  Respiratory: Clear to auscultation bilaterally with equal chest rise  Abdomen: Soft, nontender, nondistended, + bowel sounds  Extremities: warm dry without cyanosis clubbing or edema  Neuro: AAOx3, nonfocal  Skin: Without rashes exudates or nodules  Psych: Normal affect and demeanor with intact judgement and insight  Data Review   Micro Results No results found for this or any previous visit (from the past 240 hour(s)).  Radiology Reports Ct Head Wo Contrast  05/07/2014   CLINICAL DATA:  64 year old with headache since Monday. History of hypertension.  EXAM: CT HEAD WITHOUT CONTRAST  TECHNIQUE: Contiguous axial images were obtained from the base of the skull through the vertex without contrast.  COMPARISON:  None  FINDINGS: Normal appearance of the intracranial structures. No evidence for acute hemorrhage, mass lesion, midline shift, hydrocephalus or large infarct. No acute bony abnormality. The visualized sinuses are clear.  IMPRESSION: No acute intracranial abnormality.   Electronically Signed   By: Richarda Overlie M.D.   On: 05/07/2014 11:18   Dg Chest Port 1 View  05/07/2014   CLINICAL DATA:  Mid chest pain and pressure with headache  EXAM: PORTABLE CHEST - 1 VIEW  COMPARISON:  None currently available  FINDINGS: Normal heart size and mediastinal contours. Mild hyperinflation. No acute infiltrate or edema. No effusion or pneumothorax. No acute osseous findings.  IMPRESSION: No active disease.   Electronically Signed   By: Marnee Spring M.D.   On: 05/07/2014 10:30    CBC  Recent Labs Lab 05/07/14 0954  WBC 6.9  HGB 14.9  HCT 44.0  PLT 255  MCV 82.6  MCH 28.0  MCHC 33.9  RDW 13.0    Chemistries   Recent Labs Lab 05/07/14 0954  NA 138  137  K 3.8  3.8  CL 102   102  CO2 26  27  GLUCOSE 189*  193*  BUN 9  10  CREATININE 0.80  0.66  CALCIUM 9.8  9.9  AST 15  ALT 11  ALKPHOS 73  BILITOT 0.9   ------------------------------------------------------------------------------------------------------------------ estimated creatinine clearance is 69.1 mL/min (by C-G formula based on Cr of 0.66). ------------------------------------------------------------------------------------------------------------------ No results for input(s): HGBA1C in the last 72 hours. ------------------------------------------------------------------------------------------------------------------ No results for input(s): CHOL, HDL, LDLCALC, TRIG, CHOLHDL, LDLDIRECT in the last 72 hours. ------------------------------------------------------------------------------------------------------------------ No results for input(s): TSH, T4TOTAL, T3FREE, THYROIDAB in the last 72 hours.  Invalid  input(s): FREET3 ------------------------------------------------------------------------------------------------------------------ No results for input(s): VITAMINB12, FOLATE, FERRITIN, TIBC, IRON, RETICCTPCT in the last 72 hours.  Coagulation profile No results for input(s): INR, PROTIME in the last 168 hours.   Recent Labs  05/07/14 0954  DDIMER 0.32    Cardiac Enzymes  Recent Labs Lab 05/07/14 0954 05/07/14 1952 05/08/14 0026  TROPONINI <0.03 <0.03 <0.03   ------------------------------------------------------------------------------------------------------------------ Invalid input(s): POCBNP    Voyd Groft D.O. on 05/08/2014 at 1:55 PM  Between 7am to 7pm - Pager - [phone]  After 7pm go to [email or web address] - password [email or web address]  And look for the night coverage person covering for me after hours  [facility] Office  [phone]

## 2014-05-08 NOTE — Discharge Summary (Addendum)
Physician Discharge Summary  Diane InfanteFrances M Garza XNA:[id number] DOB: 1950-05-23 DOA: 05/07/2014  PCP: No primary care provider on file.  Admit date: 05/07/2014 Discharge date: 05/08/2014  Time spent: 35 minutes  Recommendations for Outpatient Follow-up:  Patient will be discharged home. She should follow-up with her primary care physician within one week of discharge. Patient to continue her medications as prescribed. Patient to follow a heart healthy/carb modified diet. Patient may resume activity as tolerated.  Patient will be contacted by cardiology for an outpatient appointment.  Discharge Diagnoses:  Chest pain, with atypical features Hypertension Headaches Diabetes mellitus, type II  Discharge Condition: Stable  Diet recommendation: Heart healthy/carb modified  Filed Weights   05/07/14 1829 05/08/14 0400  Weight: 63.458 kg (139 lb 14.4 oz) 64.048 kg (141 lb 3.2 oz)    History of present illness:  on 05/07/2014 by Dr. Jeoffrey MassedShanker Garza Diane InfanteFrances M Garza is a 64 y.o. female with a Past Medical History of hypertension (noncompliant to HCTZ-ran out 3 weeks back) diabetes, tobacco abuse who presents today with the above noted complaint. Per patient, since this past Tuesday she is felt pressure-like sensation in the center for chest. Pain is around 4/10 at its worse, without any nausea, vomiting or diarrhea. There is no radiation of the pain. Because of persistent pain she presented to the emergency room for further evaluation and treatment. She also complains of worsening headache in the bitemporal area. There is no history of fever, photophobia or neck pain. In the ED, she was found to have a significantly elevated blood pressure, given IV labetalol. Initial troponins and EKG were negative, I was asked to admit this patient for further evaluation and treatment. During the evaluation, patient is awake alert, she denied any chest pain, headache was significantly better and she rated it around  3/10.  Hospital Course:  Chest pain, with atypical features -Troponins have been cycled and negative -Possibly secondary to uncontrolled hypertension -Patient states she is currently chest pain-free, has no shortness of breath -Continue aspirin -Patient had negative d-dimer, 0.32 -Lexiscan: was tolerated without complication, EF 76%, no reversible ischemia or infarction, low risk stress test findings -Cardiology office will contact patient to schedule appointment  Accelerated hypertension -Patient has history of noncompliance due to financial issues -Continue amlodipine and HCTZ -Patient will need to follow-up with her primary care physician  Headaches -Likely secondary to uncontrolled hypertension -Patient states her headache is improved  Diabetes mellitus, type 2 -Hemoglobin A1c pending and can be followed by PCP -Metformin currently held during hospitalization, can continue upon discharge -Placed on insulin sliding scale with CBG monitoring during hospitalization   Procedures: Myoview  Consultations: Cardiology via phone  Discharge Exam: Filed Vitals:   05/08/14 1324  BP: 149/86  Pulse: 103  Temp: 98.2 F (36.8 C)  Resp: 20   Exam  General: Well developed,  NAD, appears stated age  HEENT: NCAT, mucous membranes moist.   Cardiovascular: S1 S2 auscultated, RRR, no murmurs  Respiratory: Clear to auscultation bilaterally with equal chest rise  Abdomen: Soft, nontender, nondistended, + bowel sounds  Extremities: warm dry without cyanosis clubbing or edema  Neuro: AAOx3, nonfocal  Skin: Without rashes exudates or nodules  Psych: Appropriate mood and affect  Discharge Instructions      Discharge Instructions    Discharge instructions    Complete by:  As directed   Patient will be discharged home. She should follow-up with her primary care physician within one week of discharge. Patient to continue her medications as  prescribed. Patient to follow a heart  healthy/carb modified diet. Patient may resume activity as tolerated.            Medication List    TAKE these medications        acetaminophen 325 MG tablet  Commonly known as:  TYLENOL  Take 650 mg by mouth every 6 (six) hours as needed for mild pain, moderate pain or headache.     amLODipine 5 MG tablet  Commonly known as:  NORVASC  Take 1 tablet (5 mg total) by mouth daily.     aspirin 325 MG EC tablet  Take 1 tablet (325 mg total) by mouth daily.     hydrochlorothiazide 12.5 MG capsule  Commonly known as:  MICROZIDE  Take 12.5 mg by mouth daily.     Ibuprofen-Diphenhydramine HCl 200-25 MG Caps  Take 1 tablet by mouth daily as needed (headache).     metFORMIN 500 MG tablet  Commonly known as:  GLUCOPHAGE  Take 1,000 mg by mouth 2 (two) times daily with a meal.       No Known Allergies Follow-up Information    Follow up with Primary care physician.   Why:  Hospital followup       The results of significant diagnostics from this hospitalization (including imaging, microbiology, ancillary and laboratory) are listed below for reference.    Significant Diagnostic Studies: Ct Head Wo Contrast  05/07/2014   CLINICAL DATA:  64 year old with headache since Monday. History of hypertension.  EXAM: CT HEAD WITHOUT CONTRAST  TECHNIQUE: Contiguous axial images were obtained from the base of the skull through the vertex without contrast.  COMPARISON:  None  FINDINGS: Normal appearance of the intracranial structures. No evidence for acute hemorrhage, mass lesion, midline shift, hydrocephalus or large infarct. No acute bony abnormality. The visualized sinuses are clear.  IMPRESSION: No acute intracranial abnormality.   Electronically Signed   By: Diane Garza M.D.   On: 05/07/2014 11:18   Nm Myocar Multi W/spect W/wall Motion / Ef  05/08/2014   CLINICAL DATA:  diabetes, hypertension, chest pain  EXAM: MYOCARDIAL IMAGING WITH SPECT (REST AND PHARMACOLOGIC-STRESS)  GATED LEFT  VENTRICULAR WALL MOTION STUDY  LEFT VENTRICULAR EJECTION FRACTION  TECHNIQUE: Standard myocardial SPECT imaging was performed after resting intravenous injection of 10 mCi Tc-47m sestamibi. Subsequently, intravenous infusion of Lexiscan was performed under the supervision of the Cardiology staff. At peak effect of the drug, 30 mCi Tc-57m sestamibi was injected intravenously and standard myocardial SPECT imaging was performed. Quantitative gated imaging was also performed to evaluate left ventricular wall motion, and estimate left ventricular ejection fraction.  COMPARISON:  06/05/2003  FINDINGS: Perfusion: No decreased activity in the left ventricle on stress imaging to suggest reversible ischemia or infarction.  Wall Motion: Normal left ventricular wall motion. No left ventricular dilation.  Left Ventricular Ejection Fraction: 76 %  End diastolic volume 8 ml  End systolic volume 34 ml  IMPRESSION: 1. No reversible ischemia or infarction.  2. Normal left ventricular wall motion.  3. Left ventricular ejection fraction 76%  4. Low-risk stress test findings*.  *2012 Appropriate Use Criteria for Coronary Revascularization Focused Update: J Am Coll Cardiol. 2012;59(9):857-881. http://content.dementiazones.com.aspx?articleid=[email or web address]   Electronically Signed   By: Corlis Leak M.D.   On: 05/08/2014 14:27   Dg Chest Port 1 View  05/07/2014   CLINICAL DATA:  Mid chest pain and pressure with headache  EXAM: PORTABLE CHEST - 1 VIEW  COMPARISON:  None currently available  FINDINGS: Normal  heart size and mediastinal contours. Mild hyperinflation. No acute infiltrate or edema. No effusion or pneumothorax. No acute osseous findings.  IMPRESSION: No active disease.   Electronically Signed   By: Marnee Spring M.D.   On: 05/07/2014 10:30    Microbiology: No results found for this or any previous visit (from the past 240 hour(s)).   Labs: Basic Metabolic Panel:  Recent Labs Lab 05/07/14 0954  NA 138  137  K 3.8   3.8  CL 102  102  CO2 26  27  GLUCOSE 189*  193*  BUN 9  10  CREATININE 0.80  0.66  CALCIUM 9.8  9.9   Liver Function Tests:  Recent Labs Lab 05/07/14 0954  AST 15  ALT 11  ALKPHOS 73  BILITOT 0.9  PROT 7.4  ALBUMIN 4.2   No results for input(s): LIPASE, AMYLASE in the last 168 hours. No results for input(s): AMMONIA in the last 168 hours. CBC:  Recent Labs Lab 05/07/14 0954  WBC 6.9  HGB 14.9  HCT 44.0  MCV 82.6  PLT 255   Cardiac Enzymes:  Recent Labs Lab 05/07/14 0954 05/07/14 1952 05/08/14 0026  TROPONINI <0.03 <0.03 <0.03   BNP: BNP (last 3 results) No results for input(s): BNP in the last 8760 hours.  ProBNP (last 3 results) No results for input(s): PROBNP in the last 8760 hours.  CBG:  Recent Labs Lab 05/07/14 1841 05/07/14 2103 05/08/14 0639 05/08/14 0748 05/08/14 1317  GLUCAP 180* 297* 181* 171* 262*       Signed:  Vander Kueker  [facility] 05/08/2014, 2:50 PM

## 2014-05-08 NOTE — Discharge Instructions (Signed)
Chest Pain (Nonspecific) °It is often hard to give a specific diagnosis for the cause of chest pain. There is always a chance that your pain could be related to something serious, such as a heart attack or a blood clot in the lungs. You need to follow up with your health care provider for further evaluation. °CAUSES  °· Heartburn. °· Pneumonia or bronchitis. °· Anxiety or stress. °· Inflammation around your heart (pericarditis) or lung (pleuritis or pleurisy). °· A blood clot in the lung. °· A collapsed lung (pneumothorax). It can develop suddenly on its own (spontaneous pneumothorax) or from trauma to the chest. °· Shingles infection (herpes zoster virus). °The chest wall is composed of bones, muscles, and cartilage. Any of these can be the source of the pain. °· The bones can be bruised by injury. °· The muscles or cartilage can be strained by coughing or overwork. °· The cartilage can be affected by inflammation and become sore (costochondritis). °DIAGNOSIS  °Lab tests or other studies may be needed to find the cause of your pain. Your health care provider may have you take a test called an ambulatory electrocardiogram (ECG). An ECG records your heartbeat patterns over a 24-hour period. You may also have other tests, such as: °· Transthoracic echocardiogram (TTE). During echocardiography, sound waves are used to evaluate how blood flows through your heart. °· Transesophageal echocardiogram (TEE). °· Cardiac monitoring. This allows your health care provider to monitor your heart rate and rhythm in real time. °· Holter monitor. This is a portable device that records your heartbeat and can help diagnose heart arrhythmias. It allows your health care provider to track your heart activity for several days, if needed. °· Stress tests by exercise or by giving medicine that makes the heart beat faster. °TREATMENT  °· Treatment depends on what may be causing your chest pain. Treatment may include: °· Acid blockers for  heartburn. °· Anti-inflammatory medicine. °· Pain medicine for inflammatory conditions. °· Antibiotics if an infection is present. °· You may be advised to change lifestyle habits. This includes stopping smoking and avoiding alcohol, caffeine, and chocolate. °· You may be advised to keep your head raised (elevated) when sleeping. This reduces the chance of acid going backward from your stomach into your esophagus. °Most of the time, nonspecific chest pain will improve within 2-3 days with rest and mild pain medicine.  °HOME CARE INSTRUCTIONS  °· If antibiotics were prescribed, take them as directed. Finish them even if you start to feel better. °· For the next few days, avoid physical activities that bring on chest pain. Continue physical activities as directed. °· Do not use any tobacco products, including cigarettes, chewing tobacco, or electronic cigarettes. °· Avoid drinking alcohol. °· Only take medicine as directed by your health care provider. °· Follow your health care provider's suggestions for further testing if your chest pain does not go away. °· Keep any follow-up appointments you made. If you do not go to an appointment, you could develop lasting (chronic) problems with pain. If there is any problem keeping an appointment, call to reschedule. °SEEK MEDICAL CARE IF:  °· Your chest pain does not go away, even after treatment. °· You have a rash with blisters on your chest. °· You have a fever. °SEEK IMMEDIATE MEDICAL CARE IF:  °· You have increased chest pain or pain that spreads to your arm, neck, jaw, back, or abdomen. °· You have shortness of breath. °· You have an increasing cough, or you cough   up blood. °· You have severe back or abdominal pain. °· You feel nauseous or vomit. °· You have severe weakness. °· You faint. °· You have chills. °This is an emergency. Do not wait to see if the pain will go away. Get medical help at once. Call your local emergency services (911 in [place] Do not drive  yourself to the hospital. °MAKE SURE YOU:  °· Understand these instructions. °· Will watch your condition. °· Will get help right away if you are not doing well or get worse. °Document Released: 12/20/2004 Document Revised: 03/17/2013 Document Reviewed: 10/16/2007 °ExitCare® Patient Information ©2015 [employer]. This information is not intended to replace advice given to you by your health care provider. Make sure you discuss any questions you have with your health care provider. ° °Hypertension °Hypertension, commonly called high blood pressure, is when the force of blood pumping through your arteries is too strong. Your arteries are the blood vessels that carry blood from your heart throughout your body. A blood pressure reading consists of a higher number over a lower number, such as 110/72. The higher number (systolic) is the pressure inside your arteries when your heart pumps. The lower number (diastolic) is the pressure inside your arteries when your heart relaxes. Ideally you want your blood pressure below 120/80. °Hypertension forces your heart to work harder to pump blood. Your arteries may become narrow or stiff. Having hypertension puts you at risk for heart disease, stroke, and other problems.  °RISK FACTORS °Some risk factors for high blood pressure are controllable. Others are not.  °Risk factors you cannot control include:  °· Race. You may be at higher risk if you are African American. °· Age. Risk increases with age. °· Gender. Men are at higher risk than women before age 45 years. After age 65, women are at higher risk than men. °Risk factors you can control include: °· Not getting enough exercise or physical activity. °· Being overweight. °· Getting too much fat, sugar, calories, or salt in your diet. °· Drinking too much alcohol. °SIGNS AND SYMPTOMS °Hypertension does not usually cause signs or symptoms. Extremely high blood pressure (hypertensive crisis) may cause headache, anxiety, shortness  of breath, and nosebleed. °DIAGNOSIS  °To check if you have hypertension, your health care provider will measure your blood pressure while you are seated, with your arm held at the level of your heart. It should be measured at least twice using the same arm. Certain conditions can cause a difference in blood pressure between your right and left arms. A blood pressure reading that is higher than normal on one occasion does not mean that you need treatment. If one blood pressure reading is high, ask your health care provider about having it checked again. °TREATMENT  °Treating high blood pressure includes making lifestyle changes and possibly taking medicine. Living a healthy lifestyle can help lower high blood pressure. You may need to change some of your habits. °Lifestyle changes may include: °· Following the DASH diet. This diet is high in fruits, vegetables, and whole grains. It is low in salt, red meat, and added sugars. °· Getting at least 2½ hours of brisk physical activity every week. °· Losing weight if necessary. °· Not smoking. °· Limiting alcoholic beverages. °· Learning ways to reduce stress. ° If lifestyle changes are not enough to get your blood pressure under control, your health care provider may prescribe medicine. You may need to take more than one. Work closely with your health care provider   to understand the risks and benefits. °HOME CARE INSTRUCTIONS °· Have your blood pressure rechecked as directed by your health care provider.   °· Take medicines only as directed by your health care provider. Follow the directions carefully. Blood pressure medicines must be taken as prescribed. The medicine does not work as well when you skip doses. Skipping doses also puts you at risk for problems.   °· Do not smoke.   °· Monitor your blood pressure at home as directed by your health care provider.  °SEEK MEDICAL CARE IF:  °· You think you are having a reaction to medicines taken. °· You have recurrent  headaches or feel dizzy. °· You have swelling in your ankles. °· You have trouble with your vision. °SEEK IMMEDIATE MEDICAL CARE IF: °· You develop a severe headache or confusion. °· You have unusual weakness, numbness, or feel faint. °· You have severe chest or abdominal pain. °· You vomit repeatedly. °· You have trouble breathing. °MAKE SURE YOU:  °· Understand these instructions. °· Will watch your condition. °· Will get help right away if you are not doing well or get worse. °Document Released: 03/12/2005 Document Revised: 07/27/2013 Document Reviewed: 01/02/2013 °ExitCare® Patient Information ©2015 [employer]. This information is not intended to replace advice given to you by your health care provider. Make sure you discuss any questions you have with your health care provider. ° °

## 2014-05-08 NOTE — Progress Notes (Signed)
UR completed 

## 2014-05-08 NOTE — Progress Notes (Addendum)
Tolerated Lexiscan without complication. BP elevated during test. Cardiology has not been formally involved in patient's care so will defer follow-up of study results to internal medicine team who ordered the test. [facility] Please call if we can be of further assistance. Jr Milliron PA-C

## 2014-05-10 LAB — HEMOGLOBIN A1C
Hgb A1c MFr Bld: 7.7 % — ABNORMAL HIGH (ref 4.8–5.6)
Mean Plasma Glucose: 174 mg/dL

## 2014-05-12 ENCOUNTER — Other Ambulatory Visit (HOSPITAL_COMMUNITY): Payer: Self-pay

## 2014-06-10 ENCOUNTER — Encounter: Payer: Self-pay | Admitting: Cardiovascular Disease

## 2014-06-10 ENCOUNTER — Ambulatory Visit (INDEPENDENT_AMBULATORY_CARE_PROVIDER_SITE_OTHER): Payer: Self-pay | Admitting: Cardiovascular Disease

## 2014-06-10 ENCOUNTER — Ambulatory Visit (HOSPITAL_COMMUNITY): Payer: Self-pay | Attending: Internal Medicine

## 2014-06-10 VITALS — BP 150/74 | HR 88 | Ht 66.0 in | Wt 144.0 lb

## 2014-06-10 DIAGNOSIS — I10 Essential (primary) hypertension: Secondary | ICD-10-CM

## 2014-06-10 DIAGNOSIS — R079 Chest pain, unspecified: Secondary | ICD-10-CM

## 2014-06-10 MED ORDER — ASPIRIN EC 81 MG PO TBEC: 81.0000 mg | DELAYED_RELEASE_TABLET | Freq: Every day | ORAL | Status: DC

## 2014-06-10 NOTE — Patient Instructions (Signed)
Your physician has recommended you make the following change in your medication:  DECREASE Aspirin to 81 mg once daily  Your physician recommends that you schedule a follow-up appointment in: as needed with Dr. Elease HashimotoNahser.

## 2014-06-10 NOTE — Progress Notes (Signed)
Cardiology Office Note   Date:  06/10/2014   ID:  Diane Garza, DOB 09-07-1950, MRN [id number]  PCP:  No primary care provider on file.  Cardiologist:   Conley Delisle, Deloris Ping, MD   Chief Complaint  Patient presents with  . Chest Pain   1. Non cardiac chest pain 2. Essential hypertension 3. Diabetes Mellitus    History of Present Illness: Diane Garza is a 64 y.o. female who presents for a recent hospitalization for chest pain.  She had missed several weeks of her meds and then was admitted to the hospital with chest discomfort. She ruled out for myocardial infarction. A Lexiscan Myoview study was negative for ischemia. Her left ventricle systolic function was normal with an EF of 76%.  She's back on her medications. She's feeling well. She's not having any further episodes of chest discomfort.    Past Medical History  Diagnosis Date  . Hypertension   . Diabetes mellitus without complication     Past Surgical History  Procedure Laterality Date  . Abdominal hysterectomy       Current Outpatient Prescriptions  Medication Sig Dispense Refill  . acetaminophen (TYLENOL) 325 MG tablet Take 650 mg by mouth every 6 (six) hours as needed for mild pain, moderate pain or headache.    Marland Kitchen amLODipine (NORVASC) 5 MG tablet Take 1 tablet (5 mg total) by mouth daily. 30 tablet 0  . aspirin EC 325 MG EC tablet Take 1 tablet (325 mg total) by mouth daily. 30 tablet 0  . hydrochlorothiazide (MICROZIDE) 12.5 MG capsule Take 12.5 mg by mouth daily.    . Ibuprofen-Diphenhydramine HCl 200-25 MG CAPS Take 1 tablet by mouth daily as needed (headache).    . metFORMIN (GLUCOPHAGE) 500 MG tablet Take 1,000 mg by mouth 2 (two) times daily with a meal.     No current facility-administered medications for this visit.    Allergies:   Review of patient's allergies indicates no known allergies.    Social History:  The patient  reports that she has been smoking.  She does not have any smokeless  tobacco history on file. She reports that she does not drink alcohol or use illicit drugs.   Family History:  The patient's family history is not on file.    ROS:  Please see the history of present illness.    Review of Systems: Constitutional:  denies fever, chills, diaphoresis, appetite change and fatigue.  HEENT: denies photophobia, eye pain, redness, hearing loss, ear pain, congestion, sore throat, rhinorrhea, sneezing, neck pain, neck stiffness and tinnitus.  Respiratory: denies SOB, DOE, cough, chest tightness, and wheezing.  Cardiovascular: denies chest pain, palpitations and leg swelling.  Gastrointestinal: denies nausea, vomiting, abdominal pain, diarrhea, constipation, blood in stool.  Genitourinary: denies dysuria, urgency, frequency, hematuria, flank pain and difficulty urinating.  Musculoskeletal: denies  myalgias, back pain, joint swelling, arthralgias and gait problem.   Skin: denies pallor, rash and wound.  Neurological: denies dizziness, seizures, syncope, weakness, light-headedness, numbness and headaches.   Hematological: denies adenopathy, easy bruising, personal or family bleeding history.  Psychiatric/ Behavioral: denies suicidal ideation, mood changes, confusion, nervousness, sleep disturbance and agitation.       All other systems are reviewed and negative.    PHYSICAL EXAM: VS:  BP 150/74 mmHg  Pulse 88  Ht  (1.676 m)  Wt 144 lb (65.318 kg)  BMI 23.25 kg/m2  SpO2 96% , BMI Body mass index is 23.25 kg/(m^2). GEN: Well nourished, well  developed, in no acute distress HEENT: normal Neck: no JVD, carotid bruits, or masses Cardiac: RRR; no murmurs, rubs, or gallops,no edema  Respiratory:  clear to auscultation bilaterally, normal work of breathing GI: soft, nontender, nondistended, + BS MS: no deformity or atrophy Skin: warm and dry, no rash Neuro:  Strength and sensation are intact Psych: normal   EKG:  EKG is not ordered today.    Recent  Labs: 05/07/2014: ALT 11; BUN 9; BUN 10; Creatinine 0.80; Creatinine 0.66; Hemoglobin 14.9; Platelets 255; Potassium 3.8; Potassium 3.8; Sodium 138; Sodium 137    Lipid Panel No results found for: CHOL, TRIG, HDL, CHOLHDL, VLDL, LDLCALC, LDLDIRECT    Wt Readings from Last 3 Encounters:  06/10/14 144 lb (65.318 kg)  05/08/14 141 lb 3.2 oz (64.048 kg)      Other studies Reviewed: Additional studies/ records that were reviewed today include: . Review of the above records demonstrates:    ASSESSMENT AND PLAN:  1.  Chest discomfort:  [facility]  with episodes of chest discomfort. She had not been taking her blood pressure medications. Her blood pressure was very elevated. She ruled out for myocardial infarction and a Lexiscan Myoview study was negative for ischemia. She has normal left ventricle systolic function. She's back on her antihypertensives and she's not had any further episodes of chest discomfort. Her blood pressure is much better although it is still a little elevated.  She was scheduled have an echocardiogram at our office. The patient is self-pay and told me  that she has to pay for all of these exams out of her own pocket. In my opinion, she does not necessarily need an echocardiogram. Her blood pressure is now fairly well-controlled and she's not having any symptoms of diastolic dysfunction. She's had a Myoview study and has been shown to have normal left ventricle systolic function. She does not have any significant murmurs on exam. She may reduce her aspirin to 81 mg a day.  We can do an echocardiogram at a later time if we have other indications.  2. Essential hypertension:   It is much better now that she is on her blood pressure medicines. Her blood pressure still a little elevated. I've suggested to her that she could have some additional medications added. I think that she will do well with low-dose carvedilol. Her HCTZ can also be  increased to 25 mg if needed. I've also recommended that she stop smoking.  I've recommended that she continue with her same medications at this time and follow-up with Dr. Alda BertholdSpear's office.   3. Diabetes mellitus - followed by her medical doctor .    Current medicines are reviewed at length with the patient today.  The patient does not have concerns regarding medicines.  The following changes have been made:  no change  Labs/ tests ordered today include:  No orders of the defined types were placed in this encounter.     Disposition:   FU with me as needed    Signed, Kirstyn Lean, Deloris PingPhilip J, MD  06/10/2014 10:12 AM    [facility] [address], [place], [place]  [address] Phone: [phone]; Fax: [phone]

## 2015-09-30 ENCOUNTER — Ambulatory Visit (HOSPITAL_COMMUNITY)
Admission: EM | Admit: 2015-09-30 | Discharge: 2015-09-30 | Disposition: A | Discharge status: Home / Self Care | Payer: Self-pay | Attending: Family Medicine | Admitting: Family Medicine

## 2015-09-30 ENCOUNTER — Encounter (HOSPITAL_COMMUNITY): Payer: Self-pay | Admitting: Emergency Medicine

## 2015-09-30 DIAGNOSIS — A084 Viral intestinal infection, unspecified: Secondary | ICD-10-CM

## 2015-09-30 DIAGNOSIS — R739 Hyperglycemia, unspecified: Secondary | ICD-10-CM

## 2015-09-30 DIAGNOSIS — E871 Hypo-osmolality and hyponatremia: Secondary | ICD-10-CM

## 2015-09-30 LAB — POCT I-STAT, CHEM 8
BUN: 14 mg/dL (ref 6–20)
CALCIUM ION: 1.16 mmol/L (ref 1.12–1.23)
CHLORIDE: 89 mmol/L — AB (ref 101–111)
Creatinine, Ser: 1 mg/dL (ref 0.44–1.00)
Glucose, Bld: 384 mg/dL — ABNORMAL HIGH (ref 65–99)
HEMATOCRIT: 51 % — AB (ref 36.0–46.0)
Hemoglobin: 17.3 g/dL — ABNORMAL HIGH (ref 12.0–15.0)
Potassium: 4.1 mmol/L (ref 3.5–5.1)
SODIUM: 126 mmol/L — AB (ref 135–145)
TCO2: 27 mmol/L (ref 0–100)

## 2015-09-30 LAB — POCT URINALYSIS DIP (DEVICE)
Bilirubin Urine: NEGATIVE
Glucose, UA: 500 mg/dL — AB
Ketones, ur: 40 mg/dL — AB
Leukocytes, UA: NEGATIVE
NITRITE: NEGATIVE
PH: 5.5 (ref 5.0–8.0)
Protein, ur: 100 mg/dL — AB
Specific Gravity, Urine: 1.02 (ref 1.005–1.030)
Urobilinogen, UA: 0.2 mg/dL (ref 0.0–1.0)

## 2015-09-30 MED ORDER — ONDANSETRON 4 MG PO TBDP
8.0000 mg | ORAL_TABLET | Freq: Once | ORAL | Status: AC
  Administered 2015-09-30: 8 mg via ORAL

## 2015-09-30 MED ORDER — ONDANSETRON 4 MG PO TBDP
ORAL_TABLET | ORAL | Status: AC
  Filled 2015-09-30: qty 2

## 2015-09-30 MED ORDER — INSULIN ASPART 100 UNIT/ML ~~LOC~~ SOLN
5.0000 [IU] | Freq: Once | SUBCUTANEOUS | Status: AC
  Administered 2015-09-30: 5 [IU] via SUBCUTANEOUS

## 2015-09-30 MED ORDER — ONDANSETRON 4 MG PO TBDP: 4.0000 mg | ORAL_TABLET | Freq: Three times a day (TID) | ORAL | Status: DC | PRN

## 2015-09-30 MED ORDER — INSULIN ASPART 100 UNIT/ML ~~LOC~~ SOLN
SUBCUTANEOUS | Status: AC
  Filled 2015-09-30: qty 1

## 2015-09-30 NOTE — Discharge Instructions (Signed)
Your symptoms are likely due to a combination of a viral got infection called gastroenteritis and high sugars. Your sugar is currently 384. Your given insulin to help with her high sugar. Please continue your metformin as prescribed. Please check your blood sugar daily. If your sugar gets above 150 MG continued physical ill please go to the emergency room immediately. It is not, to have a high sugar level when you're sick. Additionally your sodium was initially noted to be low and this was falsely depressed due to the high sugar. Sugar level was actually 131. Please drink plenty of sodium rich fluids or  Broths. Your symptoms should improve over the next 1-2 days. Please use Zofran for additional abdominal discomfort and nausea.

## 2015-09-30 NOTE — ED Notes (Signed)
Pt c/o abd pain onset 7/4  Sx include fevers, has had x3 loose stools today, and x2 episodes of emesis today A&O x4... NAD

## 2015-09-30 NOTE — ED Provider Notes (Signed)
CSN: [id number]     Arrival date & time 09/30/15  1503 History   First MD Initiated Contact with Patient 09/30/15 1631     Chief Complaint  Patient presents with  . Abdominal Pain   (Consider location/radiation/quality/duration/timing/severity/associated sxs/prior Treatment) HPI   3 days of chills. 2 days of diarrhea. 2 days of emesis with meals. Poor oral intake during this period of time due to nausea and emesis. Symptoms are fairly constant with waxing and waning nature. Denies dysuria, frequency, back pain, fevers, chest pain, shortness of breath, tremor, LOC, headache. Patient states that she metformin only for diabetes and does not check her sugar as she has not filled her prescription for her glucose meter.    Past Medical History  Diagnosis Date  . Hypertension   . Diabetes mellitus without complication [facility](HCC)    Past Surgical History  Procedure Laterality Date  . Abdominal hysterectomy     Family History  Problem Relation Age of Onset  . Cancer Mother   . Cancer Brother   . Cancer Other     leukemia   Social History  Substance Use Topics  . Smoking status: Current Every Day Smoker  . Smokeless tobacco: None  . Alcohol Use: No   OB History    No data available     Review of Systems Per HPI with all other pertinent systems negative.   Allergies  Review of patient's allergies indicates no known allergies.  Home Medications   Prior to Admission medications   Medication Sig Start Date End Date Taking? Authorizing Provider  amLODipine (NORVASC) 5 MG tablet Take 1 tablet (5 mg total) by mouth daily. 05/08/14  Yes Maryann Mikhail, DO  hydrochlorothiazide (MICROZIDE) 12.5 MG capsule Take 12.5 mg by mouth daily.   Yes Historical Provider, MD  metFORMIN (GLUCOPHAGE) 500 MG tablet Take 1,000 mg by mouth 2 (two) times daily with a meal.   Yes Historical Provider, MD  acetaminophen (TYLENOL) 325 MG tablet Take 650 mg by mouth every 6 (six) hours as needed for mild pain,  moderate pain or headache.    Historical Provider, MD  aspirin 81 MG tablet Take 1 tablet (81 mg total) by mouth daily. 06/10/14   Vesta MixerPhilip J Nahser, MD  Ibuprofen-Diphenhydramine HCl 200-25 MG CAPS Take 1 tablet by mouth daily as needed (headache).    Historical Provider, MD  ondansetron (ZOFRAN-ODT) 4 MG disintegrating tablet Take 1 tablet (4 mg total) by mouth every 8 (eight) hours as needed for nausea or vomiting. 09/30/15   Ozella Rocksavid J Merrell, MD   Meds Ordered and Administered this Visit   Medications  insulin aspart (novoLOG) injection 5 Units (not administered)  ondansetron (ZOFRAN-ODT) disintegrating tablet 8 mg (not administered)    BP 172/83 mmHg  Pulse 113  Temp(Src) 100.6 F (38.1 C) (Oral)  Resp 20  SpO2 96% No data found.   Physical Exam Physical Exam  Constitutional: oriented to person, place, and time. appears well-developed and well-nourished. No distress.  HENT:  Head: Normocephalic and atraumatic.  Eyes: EOMI. PERRL.  Neck: Normal range of motion.  Cardiovascular: RRR, no m/r/g, 2+ distal pulses,  Pulmonary/Chest: Effort normal and breath sounds normal. No respiratory distress.  Abdominal: Soft. Bowel sounds are normal. NonTTP, no distension.  Musculoskeletal: Normal range of motion. Non ttp, no effusion.  Neurological: alert and oriented to person, place, and time.  Skin: Skin is warm. No rash noted. non diaphoretic.  Psychiatric: normal mood and affect. behavior is normal. Judgment and thought  content normal.   ED Course  Procedures (including critical care time)  Labs Review Labs Reviewed  POCT URINALYSIS DIP (DEVICE) - Abnormal; Notable for the following:    Glucose, UA 500 (*)    Ketones, ur 40 (*)    Hgb urine dipstick MODERATE (*)    Protein, ur 100 (*)    All other components within normal limits  POCT I-STAT, CHEM 8 - Abnormal; Notable for the following:    Sodium 126 (*)    Chloride 89 (*)    Glucose, Bld 384 (*)    Hemoglobin 17.3 (*)    HCT  51.0 (*)    All other components within normal limits    Imaging Review No results found.   Visual Acuity Review  Right Eye Distance:   Left Eye Distance:   Bilateral Distance:    Right Eye Near:   Left Eye Near:    Bilateral Near:         MDM   1. Hyperglycemia   2. Hyponatremia   3. Viral gastroenteritis     Patient currently hyperglycemic state likely secondary to uncontrolled diabetes and acute illness likely viral gastroenteritis. Suspect the patient is at her nadir and she'll start to improve over the next 1-2 days. Patient given 8 mg Zofran by mouth in clinic. Patient is noted to be hyponatremic to 126 though this is falsely depressed due to hyperglycemic straight. With correction hyponatremia is actually a value of 131. With corrected sodium patients and I gap is 15 with a normal bicarbonate level. Patient given 5 units of NovoLog in clinic in to continue her metformin and to check her sugar daily. If symptoms worsen and hyperglycemia continues to elevate she'll need to go to the emergency room immediately. Suspect that patient's condition will improve rapidly as she is able to take an electrolyte rich fluids.  Ozella Rocksavid J Merrell, MD 09/30/15 [id number]

## 2015-12-08 ENCOUNTER — Encounter (HOSPITAL_COMMUNITY): Payer: Self-pay | Admitting: *Deleted

## 2015-12-08 ENCOUNTER — Ambulatory Visit (HOSPITAL_COMMUNITY)
Admission: EM | Admit: 2015-12-08 | Discharge: 2015-12-08 | Disposition: A | Discharge status: Home / Self Care | Payer: Self-pay | Attending: Internal Medicine | Admitting: Internal Medicine

## 2015-12-08 DIAGNOSIS — W19XXXA Unspecified fall, initial encounter: Secondary | ICD-10-CM

## 2015-12-08 DIAGNOSIS — T148XXA Other injury of unspecified body region, initial encounter: Secondary | ICD-10-CM

## 2015-12-08 DIAGNOSIS — T148 Other injury of unspecified body region: Secondary | ICD-10-CM

## 2015-12-08 DIAGNOSIS — S39012A Strain of muscle, fascia and tendon of lower back, initial encounter: Secondary | ICD-10-CM

## 2015-12-08 MED ORDER — NAPROXEN 250 MG PO TABS: 250.0000 mg | ORAL_TABLET | Freq: Two times a day (BID) | ORAL | 0 refills | Status: DC

## 2015-12-08 MED ORDER — TRAMADOL HCL 50 MG PO TABS: 50.0000 mg | ORAL_TABLET | Freq: Four times a day (QID) | ORAL | 0 refills | Status: DC | PRN

## 2015-12-08 MED ORDER — ONDANSETRON 4 MG PREPACK (~~LOC~~): 1.0000 | ORAL_TABLET | Freq: Three times a day (TID) | ORAL | 0 refills | Status: DC | PRN

## 2015-12-08 NOTE — Discharge Instructions (Signed)
Ice to sore areas for 2 days, then heat. Medications as directed. Follow with your doctor as needed

## 2015-12-08 NOTE — ED Triage Notes (Signed)
Pt  Felled  At  [employer]  -    Today at the  [employer]   She  Reports  Pain in the  Upper  Back  And     r knee     She  Reports  Pain   In  Groin  Area   As  Well

## 2015-12-08 NOTE — ED Provider Notes (Signed)
CSN: [id number]     Arrival date & time 12/08/15  1937 History   First MD Initiated Contact with Patient 12/08/15 2109     Chief Complaint  Patient presents with  . Fall   (Consider location/radiation/quality/duration/timing/severity/associated sxs/prior Treatment) 65 year old female states she was in a grocery store and slid on a wet floor. She states she fell relatively slowly causing one leg to go one way and one leg to go another. She states she struck her right knee on the floor and her low back. She is complaining of mild pain to the right knee, pain and soreness to the proximal adductor muscles of each thigh, right lateral thigh muscle pain, pain across the low back, the upper shoulders and a headache. She denies injuring her head striking her head. She does have a headache. Denies loss of consciousness or problems with vision, speech, hearing, swallowing. She is moving all extremities, ambulatory with smooth and balanced gait. Speech is lucid and articulate. Denies focal paresthesias or weakness.      Past Medical History:  Diagnosis Date  . Diabetes mellitus without complication (HCC)   . Hypertension    Past Surgical History:  Procedure Laterality Date  . ABDOMINAL HYSTERECTOMY     Family History  Problem Relation Age of Onset  . Cancer Mother   . Cancer Brother   . Cancer Other     leukemia   Social History  Substance Use Topics  . Smoking status: Current Every Day Smoker  . Smokeless tobacco: Not on file  . Alcohol use No   OB History    No data available     Review of Systems  Constitutional: Negative.   Respiratory: Negative.   Gastrointestinal: Negative.   Genitourinary: Negative.   Musculoskeletal: Positive for arthralgias, back pain, myalgias and neck pain. Negative for joint swelling.       As per history of present illness  Skin: Negative.   Neurological: Positive for headaches. Negative for dizziness, tremors, seizures, syncope, speech difficulty,  weakness and light-headedness.  Psychiatric/Behavioral: Negative.     Allergies  Review of patient's allergies indicates no known allergies.  Home Medications   Prior to Admission medications   Medication Sig Start Date End Date Taking? Authorizing Provider  acetaminophen (TYLENOL) 325 MG tablet Take 650 mg by mouth every 6 (six) hours as needed for mild pain, moderate pain or headache.    Historical Provider, MD  amLODipine (NORVASC) 5 MG tablet Take 1 tablet (5 mg total) by mouth daily. 05/08/14   Maryann Mikhail, DO  aspirin 81 MG tablet Take 1 tablet (81 mg total) by mouth daily. 06/10/14   Vesta Mixer, MD  hydrochlorothiazide (MICROZIDE) 12.5 MG capsule Take 12.5 mg by mouth daily.    Historical Provider, MD  Ibuprofen-Diphenhydramine HCl 200-25 MG CAPS Take 1 tablet by mouth daily as needed (headache).    Historical Provider, MD  metFORMIN (GLUCOPHAGE) 500 MG tablet Take 1,000 mg by mouth 2 (two) times daily with a meal.    Historical Provider, MD  naproxen (NAPROSYN) 250 MG tablet Take 1 tablet (250 mg total) by mouth 2 (two) times daily with a meal. 12/08/15   Hayden Rasmussen, NP  ondansetron (ZOFRAN) 4 mg TABS tablet Take 4 tablets by mouth every 8 (eight) hours as needed. 12/08/15   Hayden Rasmussen, NP  ondansetron (ZOFRAN-ODT) 4 MG disintegrating tablet Take 1 tablet (4 mg total) by mouth every 8 (eight) hours as needed for nausea or vomiting. 09/30/15  Ozella Rocks, MD  traMADol (ULTRAM) 50 MG tablet Take 1 tablet (50 mg total) by mouth every 6 (six) hours as needed. 12/08/15   Hayden Rasmussen, NP   Meds Ordered and Administered this Visit  Medications - No data to display  BP 162/88   Pulse 78   Temp 98.6 F (37 C) (Oral)   Resp 18   SpO2 100%  No data found.   Physical Exam  Constitutional: She is oriented to person, place, and time. She appears well-developed and well-nourished. No distress.  HENT:  Head: Normocephalic and atraumatic.  Right Ear: External ear normal.  Left  Ear: External ear normal.  Nose: Nose normal.  Mouth/Throat: Oropharynx is clear and moist. No oropharyngeal exudate.  No evidence of head or facial injury.  Eyes: Conjunctivae and EOM are normal. Pupils are equal, round, and reactive to light.  Neck: Normal range of motion. Neck supple.  Palpation of the cervical spine reveals no deformity, discoloration, tenderness or swelling. There is tenderness to the paracervical musculature and trapezii. Full range of motion of the neck.  Cardiovascular: Normal rate, regular rhythm, normal heart sounds and intact distal pulses.   Pulmonary/Chest: Breath sounds normal. No respiratory distress. She has no wheezes.  Abdominal: Soft. There is no tenderness.  Musculoskeletal: Normal range of motion. She exhibits no edema or deformity.  Able to place arms above her head and behind her back. Good range of motion of the upper extremities. Examination of the right knee reveals no for many, swelling, discoloration or evidence of trauma. Palpation reveals no tenderness. Minor tenderness to the right thigh musculature as well as the proximal adductor musculature. Again no discoloration or swelling. Tenderness across the low back, para lumbar musculature and the parathoracic musculature. No spinal tenderness, deformity or swelling. Able to flex the spine to 45. No evidence of fractures. No joint swelling.  Lymphadenopathy:    She has no cervical adenopathy.  Neurological: She is alert and oriented to person, place, and time. She has normal reflexes. No cranial nerve deficit. She exhibits normal muscle tone.  Skin: Skin is warm and dry. Capillary refill takes less than 2 seconds. No rash noted. No erythema.  Psychiatric: She has a normal mood and affect. Her behavior is normal.  Nursing note and vitals reviewed.   Urgent Care Course   Clinical Course    Procedures (including critical care time)  Labs Review Labs Reviewed - No data to display  Imaging  Review No results found.   Visual Acuity Review  Right Eye Distance:   Left Eye Distance:   Bilateral Distance:    Right Eye Near:   Left Eye Near:    Bilateral Near:         MDM   1. Fall, initial encounter   2. Contusion   3. Muscle strain   4. Back strain, initial encounter    Ice to the sore areas for the next 2 days then apply heat. Take medication as directed. Realized that he will be sore all over for the next 2-3 days. For vomiting, severe headache, problems with vision, speech, hearing, unusual sleepiness or lethargy, focal weakness or other problems seek medical attention promptly. Meds ordered this encounter  Medications  . traMADol (ULTRAM) 50 MG tablet    Sig: Take 1 tablet (50 mg total) by mouth every 6 (six) hours as needed.    Dispense:  15 tablet    Refill:  0    Order Specific Question:  Supervising Provider    Answer:   Eustace MooreMURRAY, LAURA W [[id number]]  . naproxen (NAPROSYN) 250 MG tablet    Sig: Take 1 tablet (250 mg total) by mouth 2 (two) times daily with a meal.    Dispense:  15 tablet    Refill:  0    Order Specific Question:   Supervising Provider    Answer:   Eustace MooreMURRAY, LAURA W [[id number]]  . ondansetron (ZOFRAN) 4 mg TABS tablet    Sig: Take 4 tablets by mouth every 8 (eight) hours as needed.    Dispense:  4 tablet    Refill:  0    Order Specific Question:   Supervising Provider    Answer:   Eustace MooreMURRAY, LAURA W [[id number]]       Hayden Rasmussenavid Concepcion Kirkpatrick, NP 12/08/15 2132

## 2017-09-16 ENCOUNTER — Ambulatory Visit (HOSPITAL_COMMUNITY)
Admission: EM | Admit: 2017-09-16 | Discharge: 2017-09-16 | Disposition: A | Discharge status: Home / Self Care | Payer: Self-pay | Attending: Family Medicine | Admitting: Family Medicine

## 2017-09-16 ENCOUNTER — Encounter (HOSPITAL_COMMUNITY): Payer: Self-pay | Admitting: Emergency Medicine

## 2017-09-16 DIAGNOSIS — G44209 Tension-type headache, unspecified, not intractable: Secondary | ICD-10-CM

## 2017-09-16 DIAGNOSIS — S161XXA Strain of muscle, fascia and tendon at neck level, initial encounter: Secondary | ICD-10-CM

## 2017-09-16 DIAGNOSIS — M545 Low back pain, unspecified: Secondary | ICD-10-CM

## 2017-09-16 MED ORDER — METHOCARBAMOL 500 MG PO TABS: 500.0000 mg | ORAL_TABLET | Freq: Three times a day (TID) | ORAL | 0 refills | Status: AC

## 2017-09-16 MED ORDER — KETOROLAC TROMETHAMINE 60 MG/2ML IM SOLN
INTRAMUSCULAR | Status: AC
  Filled 2017-09-16: qty 2

## 2017-09-16 MED ORDER — KETOROLAC TROMETHAMINE 60 MG/2ML IM SOLN
60.0000 mg | Freq: Once | INTRAMUSCULAR | Status: AC
  Administered 2017-09-16: 60 mg via INTRAMUSCULAR

## 2017-09-16 MED ORDER — NAPROXEN 500 MG PO TABS: 500.0000 mg | ORAL_TABLET | Freq: Two times a day (BID) | ORAL | 0 refills | Status: DC
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
## 2017-09-16 NOTE — Discharge Instructions (Signed)
Use anti-inflammatories for pain/swelling. You may take up to 800 mg Ibuprofen every 8 hours with food. You may supplement Ibuprofen with Tylenol [phone] mg every 8 hours Or I have sent in naprosyn  You may use robaxin as needed to help with pain. This is a muscle relaxer and may cause sedation- please use only at bedtime or when you will be home and not have to drive/work  Please use ice and heating pad to help with swelling and discomfort.   Return if not improving, symptoms worsening, developing numbness or tingling, nausea, vomiting, abdominal pain, chest pain, shortness of breath or changes in vision or headaches, loss of bowel or bladder control
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
## 2017-12-07 ENCOUNTER — Ambulatory Visit (HOSPITAL_COMMUNITY)
Admission: EM | Admit: 2017-12-07 | Discharge: 2017-12-07 | Disposition: A | Discharge status: Home / Self Care | Payer: Medicare HMO | Attending: Internal Medicine | Admitting: Internal Medicine

## 2017-12-07 ENCOUNTER — Encounter (HOSPITAL_COMMUNITY): Payer: Self-pay | Admitting: *Deleted

## 2017-12-07 DIAGNOSIS — M545 Low back pain, unspecified: Secondary | ICD-10-CM

## 2017-12-07 DIAGNOSIS — M542 Cervicalgia: Secondary | ICD-10-CM

## 2017-12-07 DIAGNOSIS — M546 Pain in thoracic spine: Secondary | ICD-10-CM

## 2017-12-07 MED ORDER — MELOXICAM 7.5 MG PO TABS: 7.5000 mg | ORAL_TABLET | Freq: Every day | ORAL | 0 refills | Status: DC
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
## 2017-12-25 ENCOUNTER — Other Ambulatory Visit: Payer: Self-pay | Admitting: Nurse Practitioner

## 2017-12-25 DIAGNOSIS — R5381 Other malaise: Secondary | ICD-10-CM

## 2018-01-02 ENCOUNTER — Other Ambulatory Visit: Payer: Self-pay | Admitting: Nurse Practitioner

## 2018-01-02 DIAGNOSIS — Z1231 Encounter for screening mammogram for malignant neoplasm of breast: Secondary | ICD-10-CM

## 2018-01-02 DIAGNOSIS — E2839 Other primary ovarian failure: Secondary | ICD-10-CM

## 2018-03-06 ENCOUNTER — Ambulatory Visit
Admission: RE | Admit: 2018-03-06 | Discharge: 2018-03-06 | Disposition: A | Discharge status: Home / Self Care | Payer: Medicare HMO | Source: Ambulatory Visit | Attending: Nurse Practitioner | Admitting: Nurse Practitioner

## 2018-03-06 DIAGNOSIS — Z1231 Encounter for screening mammogram for malignant neoplasm of breast: Secondary | ICD-10-CM

## 2018-03-06 DIAGNOSIS — E2839 Other primary ovarian failure: Secondary | ICD-10-CM

## 2018-06-11 ENCOUNTER — Other Ambulatory Visit: Payer: Self-pay | Admitting: Nurse Practitioner

## 2018-06-11 DIAGNOSIS — Z122 Encounter for screening for malignant neoplasm of respiratory organs: Secondary | ICD-10-CM

## 2018-06-18 ENCOUNTER — Ambulatory Visit: Payer: Medicare HMO

## 2018-09-10 ENCOUNTER — Ambulatory Visit
Admission: RE | Admit: 2018-09-10 | Discharge: 2018-09-10 | Disposition: A | Discharge status: Home / Self Care | Payer: Medicare HMO | Source: Ambulatory Visit | Attending: Nurse Practitioner | Admitting: Nurse Practitioner

## 2018-09-10 DIAGNOSIS — Z122 Encounter for screening for malignant neoplasm of respiratory organs: Secondary | ICD-10-CM

## 2019-12-22 ENCOUNTER — Other Ambulatory Visit: Payer: Self-pay | Admitting: Nurse Practitioner

## 2019-12-22 DIAGNOSIS — N63 Unspecified lump in unspecified breast: Secondary | ICD-10-CM

## 2019-12-23 ENCOUNTER — Other Ambulatory Visit: Payer: Self-pay

## 2019-12-23 ENCOUNTER — Encounter (HOSPITAL_COMMUNITY): Payer: Self-pay | Admitting: *Deleted

## 2019-12-23 ENCOUNTER — Ambulatory Visit (HOSPITAL_COMMUNITY)
Admission: EM | Admit: 2019-12-23 | Discharge: 2019-12-23 | Disposition: A | Discharge status: Home / Self Care | Payer: Medicare HMO | Attending: Family Medicine | Admitting: Family Medicine

## 2019-12-23 DIAGNOSIS — S161XXA Strain of muscle, fascia and tendon at neck level, initial encounter: Secondary | ICD-10-CM

## 2019-12-23 DIAGNOSIS — S39012A Strain of muscle, fascia and tendon of lower back, initial encounter: Secondary | ICD-10-CM

## 2019-12-23 MED ORDER — CYCLOBENZAPRINE HCL 5 MG PO TABS: ORAL_TABLET | ORAL | 0 refills | Status: AC

## 2019-12-23 MED ORDER — MELOXICAM 7.5 MG PO TABS: 7.5000 mg | ORAL_TABLET | Freq: Every day | ORAL | 0 refills | Status: AC
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
## 2020-01-06 ENCOUNTER — Ambulatory Visit
Admission: RE | Admit: 2020-01-06 | Discharge: 2020-01-06 | Disposition: A | Discharge status: Home / Self Care | Payer: Medicare HMO | Source: Ambulatory Visit | Attending: Nurse Practitioner | Admitting: Nurse Practitioner

## 2020-01-06 ENCOUNTER — Other Ambulatory Visit: Payer: Self-pay

## 2020-01-06 DIAGNOSIS — N63 Unspecified lump in unspecified breast: Secondary | ICD-10-CM

## 2021-06-07 ENCOUNTER — Other Ambulatory Visit: Payer: Self-pay | Admitting: Nurse Practitioner

## 2021-06-07 DIAGNOSIS — Z780 Asymptomatic menopausal state: Secondary | ICD-10-CM

## 2021-06-07 DIAGNOSIS — M8580 Other specified disorders of bone density and structure, unspecified site: Secondary | ICD-10-CM

## 2021-06-12 ENCOUNTER — Other Ambulatory Visit: Payer: Self-pay | Admitting: Nurse Practitioner

## 2021-06-12 DIAGNOSIS — Z1231 Encounter for screening mammogram for malignant neoplasm of breast: Secondary | ICD-10-CM

## 2021-11-24 ENCOUNTER — Ambulatory Visit
Admission: RE | Admit: 2021-11-24 | Discharge: 2021-11-24 | Disposition: A | Discharge status: Home / Self Care | Payer: Medicare HMO | Source: Ambulatory Visit | Attending: Nurse Practitioner | Admitting: Nurse Practitioner

## 2021-11-24 ENCOUNTER — Ambulatory Visit
Admission: RE | Admit: 2021-11-24 | Discharge: 2021-11-24 | Disposition: A | Discharge status: Home / Self Care | Payer: Self-pay | Source: Ambulatory Visit | Attending: Nurse Practitioner | Admitting: Nurse Practitioner

## 2021-11-24 DIAGNOSIS — Z1231 Encounter for screening mammogram for malignant neoplasm of breast: Secondary | ICD-10-CM

## 2021-11-24 DIAGNOSIS — Z780 Asymptomatic menopausal state: Secondary | ICD-10-CM

## 2021-11-24 DIAGNOSIS — M8580 Other specified disorders of bone density and structure, unspecified site: Secondary | ICD-10-CM

## 2022-09-08 IMAGING — MG DIGITAL DIAGNOSTIC BILAT W/ TOMO W/ CAD
8 series · 8 of 24 positions shown · non-contrast
Comparison: Previous exam(s).

CLINICAL DATA: 69-year-old with a possible RIGHT breast mass
identified on outside CT of the chest. Annual evaluation, LEFT
breast.

EXAM:
DIGITAL DIAGNOSTIC BILATERAL MAMMOGRAM WITH CAD AND TOMO
ULTRASOUND RIGHT BREAST

[R MLO synth-2D]
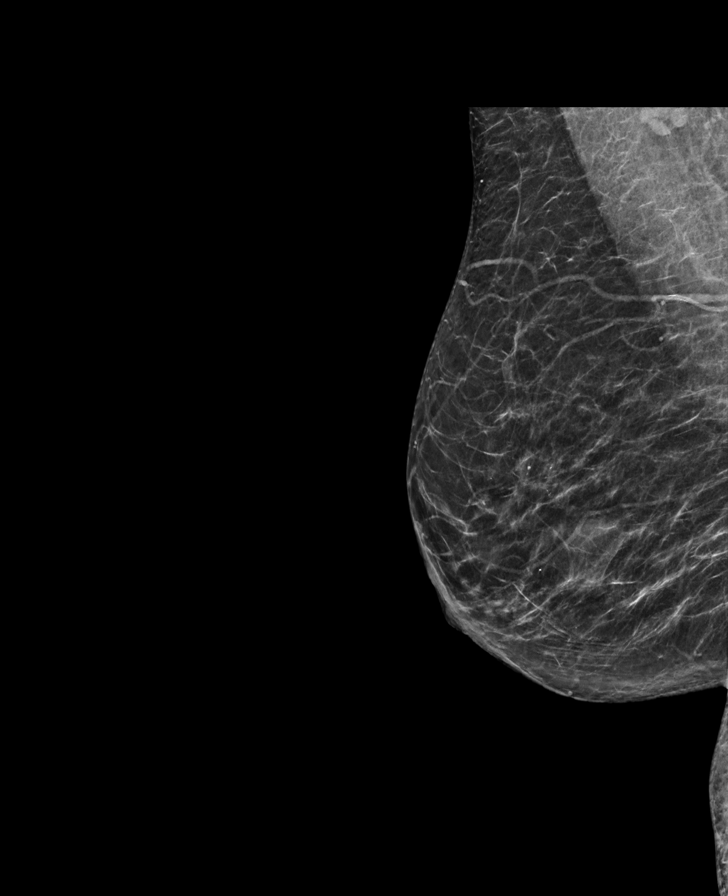

[R CC synth-2D]
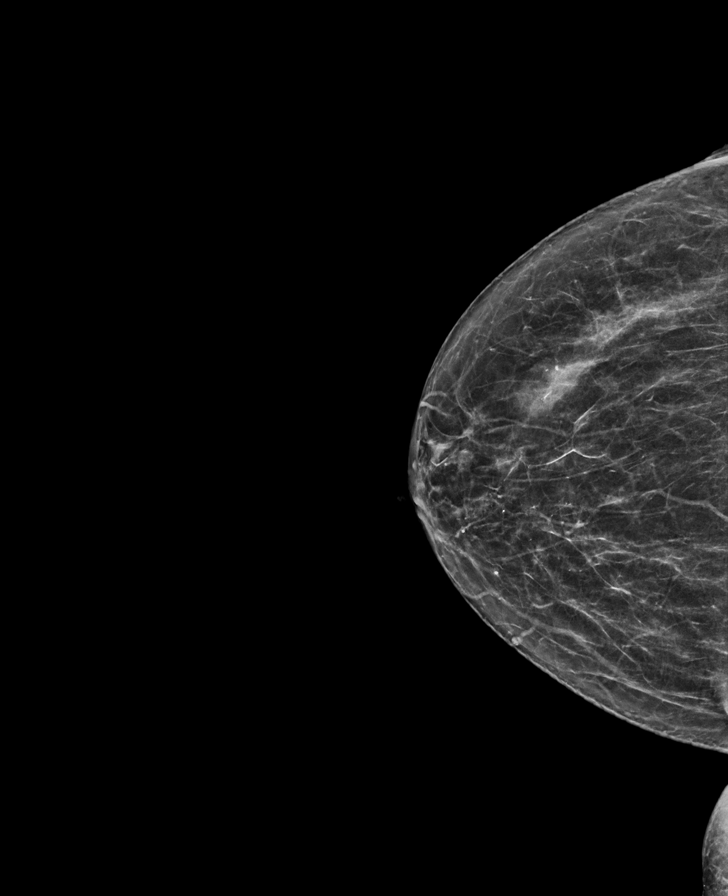

[L MLO synth-2D]
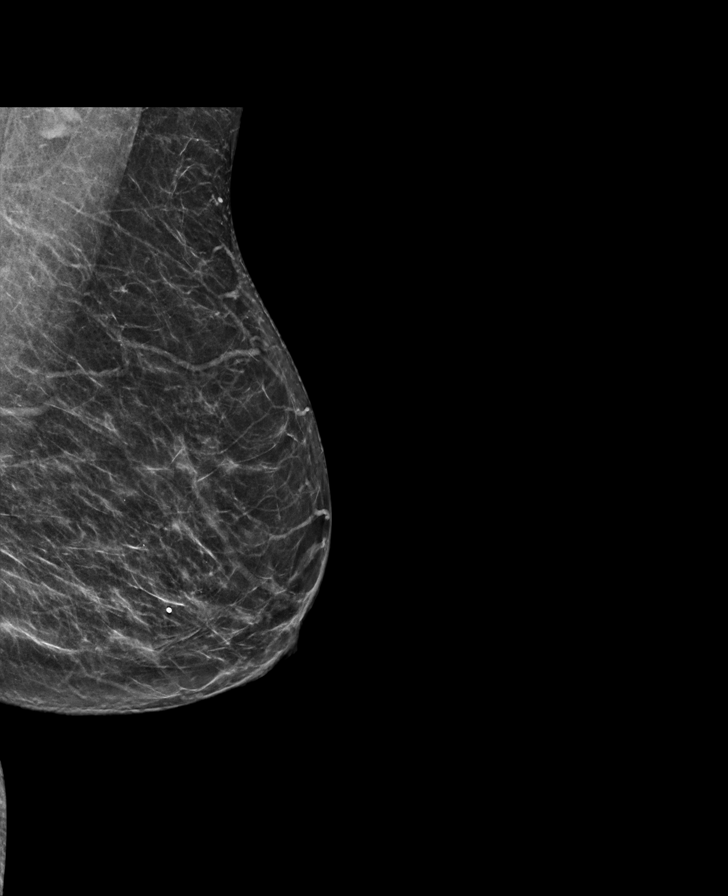

[L CC synth-2D]
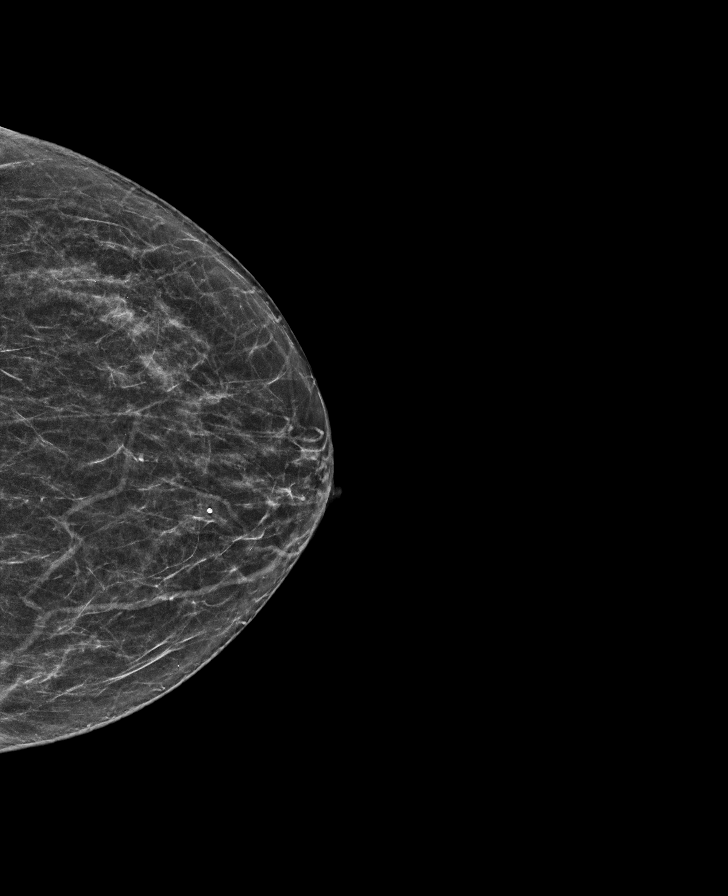

[R MLO tomo · tomo slice 33/64.0]
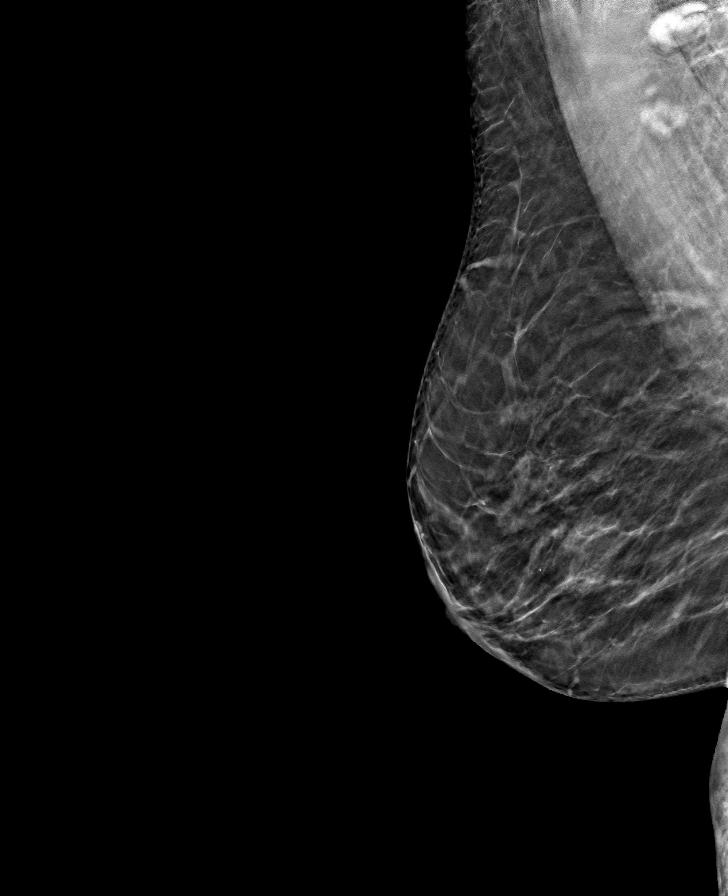

[L MLO tomo · tomo slice 33/65.0]
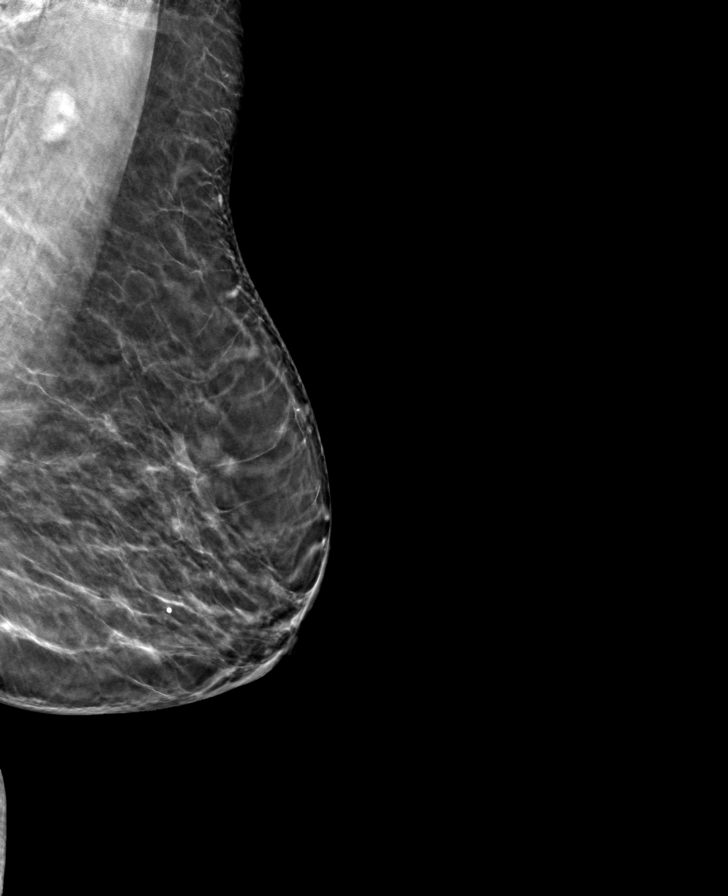

[R CC tomo · tomo slice 31/61.0]
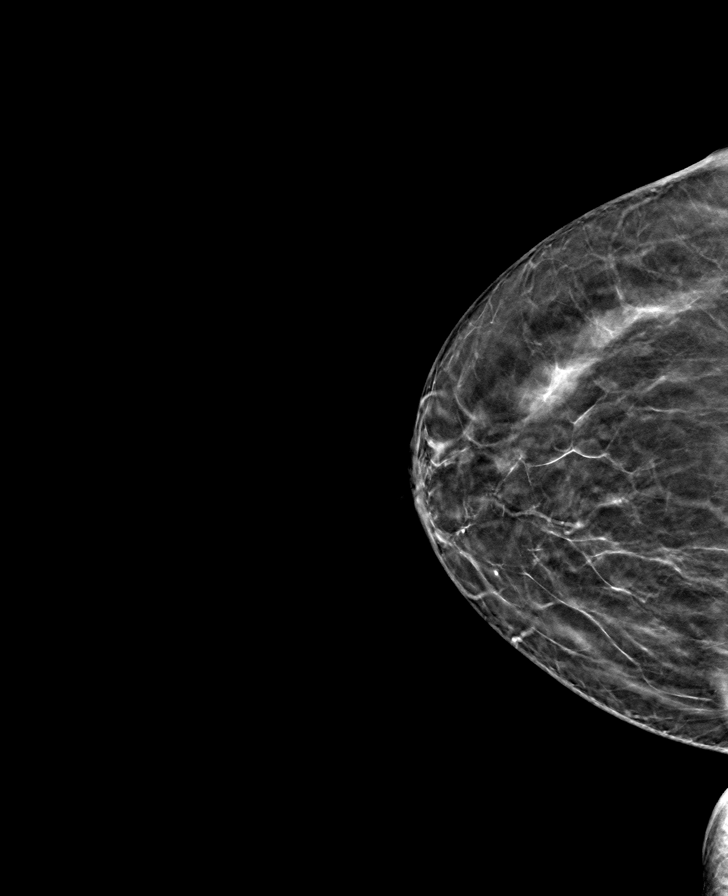

[L CC tomo · tomo slice 29/57.0]
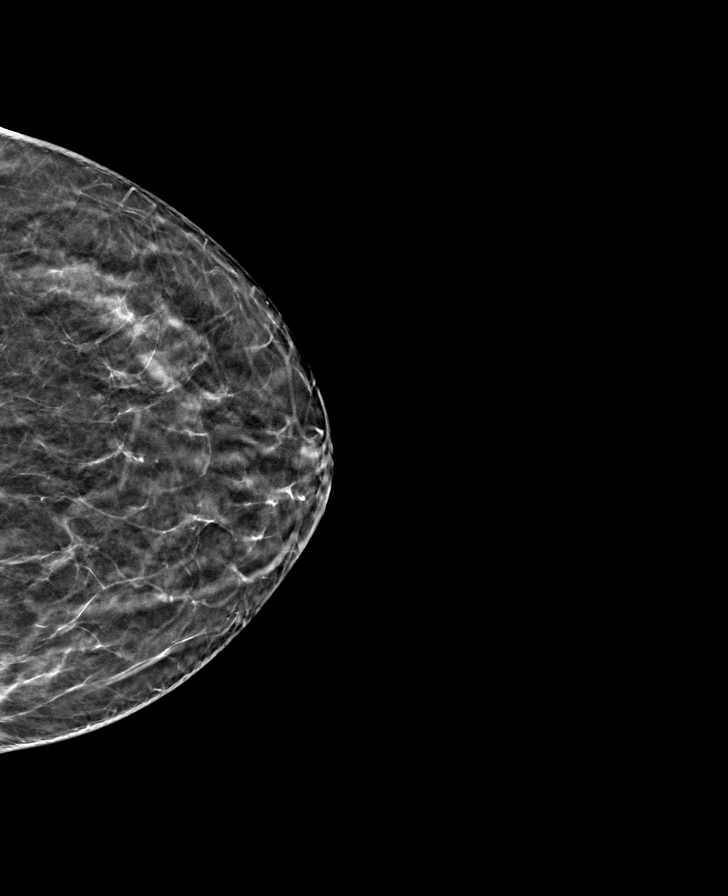

[8 of 24 positions shown; findings below may reference images not displayed]

ACR Breast Density Category b: There are scattered areas of
fibroglandular density.
FINDINGS: Tomosynthesis and synthesized full field CC and MLO views of both
breasts were obtained.

A possible partially obscured isodense mass is present the OUTER
RIGHT breast at MIDDLE depth, likely in the LOWER QUADRANT, not
significantly changed when compared to prior mammograms dating back
to 8711. There is no associated architectural distortion or
suspicious calcifications. A 7 mm circumscribed isodense mass is
present in the subareolar location which is similarly stable. No new
or suspicious findings in the RIGHT breast.

No findings suspicious for malignancy in the LEFT breast.

Mammographic images were processed with CAD.

Targeted RIGHT breast ultrasound is performed, showing an oval
circumscribed parallel hypoechoic mass at the 8 o'clock position
approximately 4 cm from nipple measuring approximately 2.4 x 0.6 x
1.7 cm, demonstrating posterior acoustic enhancement and no internal
power Doppler flow, corresponding to the stable mammographic
finding. No suspicious solid mass or abnormal acoustic shadowing is
identified.
IMPRESSION: 1. No mammographic or sonographic evidence of malignancy involving
the RIGHT breast.
2. No mammographic evidence of malignancy involving the LEFT breast.
3. Benign 2.4 cm fibroadenoma involving the LOWER OUTER RIGHT breast
which has been mammographically stable dating back to 8711.

RECOMMENDATION:
Screening mammogram in one year.(Code:SA-6-5D6)

I have discussed the findings and recommendations with the patient.
If applicable, a reminder letter will be sent to the patient
regarding the next appointment.

BI-RADS CATEGORY  2: Benign.

## 2023-05-15 ENCOUNTER — Emergency Department (HOSPITAL_BASED_OUTPATIENT_CLINIC_OR_DEPARTMENT_OTHER): Payer: Medicare HMO

## 2023-05-15 ENCOUNTER — Other Ambulatory Visit: Payer: Self-pay

## 2023-05-15 ENCOUNTER — Encounter (HOSPITAL_BASED_OUTPATIENT_CLINIC_OR_DEPARTMENT_OTHER): Payer: Self-pay | Admitting: Emergency Medicine

## 2023-05-15 ENCOUNTER — Emergency Department (HOSPITAL_BASED_OUTPATIENT_CLINIC_OR_DEPARTMENT_OTHER)
Admission: EM | Admit: 2023-05-15 | Discharge: 2023-05-15 | Disposition: A | Discharge status: Home / Self Care | Payer: Medicare HMO | Attending: Emergency Medicine | Admitting: Emergency Medicine

## 2023-05-15 DIAGNOSIS — M25572 Pain in left ankle and joints of left foot: Secondary | ICD-10-CM | POA: Diagnosis present

## 2023-05-15 DIAGNOSIS — Z7984 Long term (current) use of oral hypoglycemic drugs: Secondary | ICD-10-CM | POA: Insufficient documentation

## 2023-05-15 DIAGNOSIS — Z79899 Other long term (current) drug therapy: Secondary | ICD-10-CM | POA: Diagnosis not present

## 2023-05-15 DIAGNOSIS — M25472 Effusion, left ankle: Secondary | ICD-10-CM

## 2023-05-15 MED ORDER — ACETAMINOPHEN 500 MG PO TABS
1000.0000 mg | ORAL_TABLET | Freq: Once | ORAL | Status: AC
  Administered 2023-05-15: 1000 mg via ORAL
  Filled 2023-05-15: qty 2

## 2023-05-15 MED ORDER — OXYCODONE HCL 5 MG PO TABS
5.0000 mg | ORAL_TABLET | Freq: Once | ORAL | Status: AC
  Administered 2023-05-15: 5 mg via ORAL
  Filled 2023-05-15: qty 1

## 2023-05-15 NOTE — Discharge Instructions (Signed)
 Take 4 over the counter ibuprofen tablets 3 times a day or 2 over-the-counter naproxen tablets twice a day for pain. Also take tylenol 1000mg (2 extra strength) four times a day.

## 2023-05-15 NOTE — ED Notes (Signed)
 RN reviewed discharge instructions with pt. Pt verbalized understanding and had no further questions. VSS upon discharge.

## 2023-05-15 NOTE — ED Triage Notes (Signed)
 Pt c/o left foot pain after rolling it in the garage on uneven ground.

## 2023-05-15 NOTE — ED Provider Notes (Signed)
 [facility] AT [facility] Provider Note   CSN: [id number] Arrival date & time: 05/15/23  1907     History  Chief Complaint  Patient presents with   Foot Injury    Diane Garza is a 73 y.o. female.  73 yo F with a chief complaints of left ankle pain.  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  Complaining of pain to the foot ankle and knee.  She denies fall.  Occurred about 8 hours ago.  Has been able to walk.   Foot Injury      Home Medications Prior to Admission medications   Medication Sig Start Date End Date Taking? Authorizing Provider  acetaminophen (TYLENOL) 325 MG tablet Take 650 mg by mouth every 6 (six) hours as needed for mild pain, moderate pain or headache.    [provider]  amLODipine (NORVASC) 5 MG tablet Take 1 tablet (5 mg total) by mouth daily. 05/08/14   Mikhail, Nita Sells, DO  benazepril (LOTENSIN) 40 MG tablet Take 40 mg by mouth daily.    [provider]  cyclobenzaprine (FLEXERIL) 5 MG tablet Take 1/2 to full tab up to 3 times per day as needed. Do not drink alcohol or drive while taking this 07/03/79   Particia Nearing, PA-C  gabapentin (NEURONTIN) 300 MG capsule Take 300 mg by mouth 3 (three) times daily.    [provider]  glimepiride (AMARYL) 4 MG tablet Take 4 mg by mouth daily with breakfast.    [provider]  Ibuprofen-Diphenhydramine HCl 200-25 MG CAPS Take 1 tablet by mouth daily as needed (headache).    [provider]  meloxicam (MOBIC) 7.5 MG tablet Take 1 tablet (7.5 mg total) by mouth daily. 12/23/19   Particia Nearing, PA-C  metFORMIN (GLUCOPHAGE) 500 MG tablet Take 1,000 mg by mouth 2 (two) times daily with a meal.    [provider]      Allergies    Patient has no known allergies.    Review of Systems   Review of  Systems  Physical Exam Updated Vital Signs BP (!) 172/67 (BP Location: Right Arm)   Pulse (!) 107   Temp 97.9 F (36.6 C)   Resp 17   Ht 5' 5.5" (1.664 m)   Wt 67.1 kg   SpO2 99%   BMI 24.25 kg/m  Physical Exam Vitals and nursing note reviewed.  Constitutional:      General: She is not in acute distress.    Appearance: She is well-developed. She is not diaphoretic.  HENT:     Head: Normocephalic and atraumatic.  Eyes:     Pupils: Pupils are equal, round, and reactive to light.  Cardiovascular:     Rate and Rhythm: Normal rate and regular rhythm.     Heart sounds: No murmur heard.    No friction rub. No gallop.  Pulmonary:     Effort: Pulmonary effort is normal.     Breath sounds: No wheezing or rales.  Abdominal:     General: There is no distension.     Palpations: Abdomen is soft.     Tenderness: There is no abdominal tenderness.  Musculoskeletal:        General: Swelling and tenderness present.     Cervical back: Normal range of motion and neck supple.     Comments: Pain  and swelling to the left lower extremity.  She has diffuse pain about the left ankle and foot.  She localizes it to the area I would expect the anterior talofibular ligament.  She does have pain to the base of the fifth metatarsal and the lateral malleolus.  Pulse motor and sensation intact distally.  She has pain at the fibular neck.  She also has some pain along the lateral aspect of the leg along the area of the IT band.  Able to internally and externally rotate the hip without obvious hip discomfort.  Skin:    General: Skin is warm and dry.  Neurological:     Mental Status: She is alert and oriented to person, place, and time.  Psychiatric:        Behavior: Behavior normal.     ED Results / Procedures / Treatments   Labs (all labs ordered are listed, but only abnormal results are displayed) Labs Reviewed - No data to display  EKG None  Radiology DG Knee Complete 4 Views Left Result Date:  05/15/2023 CLINICAL DATA:  base of fifth metatarsal pain Pt c/o left foot pain after rolling it in the garage on uneven ground. EXAM: LEFT KNEE - COMPLETE 4+ VIEW COMPARISON:  None Available. FINDINGS: No evidence of fracture, dislocation, or joint effusion. No evidence of arthropathy or other focal bone abnormality. Soft tissues are unremarkable. Vascular calcifications. IMPRESSION: Negative. Electronically Signed   By: Tish Frederickson M.D.   On: 05/15/2023 19:54   DG Foot Complete Left Result Date: 05/15/2023 CLINICAL DATA:  base of fifth metatarsal pain EXAM: LEFT FOOT - COMPLETE 3+ VIEW; LEFT ANKLE COMPLETE - 3+ VIEW COMPARISON:  None Available. FINDINGS: There is no evidence of fracture or dislocation. There is no evidence of arthropathy or other focal bone abnormality. Diffuse severe soft tissue edema of the ankle. IMPRESSION: No acute displaced fracture or dislocation of the bones of the left foot and ankle. Electronically Signed   By: Tish Frederickson M.D.   On: 05/15/2023 19:53   DG Ankle Complete Left Result Date: 05/15/2023 CLINICAL DATA:  base of fifth metatarsal pain EXAM: LEFT FOOT - COMPLETE 3+ VIEW; LEFT ANKLE COMPLETE - 3+ VIEW COMPARISON:  None Available. FINDINGS: There is no evidence of fracture or dislocation. There is no evidence of arthropathy or other focal bone abnormality. Diffuse severe soft tissue edema of the ankle. IMPRESSION: No acute displaced fracture or dislocation of the bones of the left foot and ankle. Electronically Signed   By: Tish Frederickson M.D.   On: 05/15/2023 19:53    Procedures Procedures    Medications Ordered in ED Medications  acetaminophen (TYLENOL) tablet 1,000 mg (1,000 mg Oral Given 05/15/23 1927)  oxyCODONE (Oxy IR/ROXICODONE) immediate release tablet 5 mg (5 mg Oral Given 05/15/23 1927)    ED Course/ Medical Decision Making/ A&P                                 Medical Decision Making Amount and/or Complexity of Data Reviewed Radiology:  ordered.  Risk OTC drugs. Prescription drug management.   73 yo F with a chief complaints of left foot and ankle pain.  The patient inverted her ankle while ambulating.  No fall.  She does have significant pain and swelling.  Will obtain plain films.  Reassess.  Plain film of the left foot independently interpreted by me without fracture.  Plain film of the left  ankle independently interpreted by me without fracture or dislocation.  Plain film of the left knee independently interpreted by me without fracture.  I discussed results with the patient and family.  ASO crutches.  PCP follow-up.  8:15 PM:  I have discussed the diagnosis/risks/treatment options with the patient and family.  Evaluation and diagnostic testing in the emergency department does not suggest an emergent condition requiring admission or immediate intervention beyond what has been performed at this time.  They will follow up with PCP. We also discussed returning to the ED immediately if new or worsening sx occur. We discussed the sx which are most concerning (e.g., sudden worsening pain, fever, inability to tolerate by mouth) that necessitate immediate return. Medications administered to the patient during their visit and any new prescriptions provided to the patient are listed below.  Medications given during this visit Medications  acetaminophen (TYLENOL) tablet 1,000 mg (1,000 mg Oral Given 05/15/23 1927)  oxyCODONE (Oxy IR/ROXICODONE) immediate release tablet 5 mg (5 mg Oral Given 05/15/23 1927)     The patient appears reasonably screen and/or stabilized for discharge and I doubt any other medical condition or other [facility] requiring further screening, evaluation, or treatment in the ED at this time prior to discharge.         Final Clinical Impression(s) / ED Diagnoses Final diagnoses:  Pain and swelling of ankle, left    Rx / DC Orders ED Discharge Orders     None         Melene Plan, DO 05/15/23 2015

## 2023-05-17 ENCOUNTER — Telehealth (HOSPITAL_BASED_OUTPATIENT_CLINIC_OR_DEPARTMENT_OTHER): Payer: Self-pay | Admitting: Emergency Medicine

## 2023-06-29 ENCOUNTER — Other Ambulatory Visit: Payer: Self-pay

## 2023-06-29 ENCOUNTER — Emergency Department (HOSPITAL_BASED_OUTPATIENT_CLINIC_OR_DEPARTMENT_OTHER)
Admission: EM | Admit: 2023-06-29 | Discharge: 2023-06-29 | Disposition: A | Discharge status: Home / Self Care | Attending: Emergency Medicine | Admitting: Emergency Medicine

## 2023-06-29 ENCOUNTER — Emergency Department (HOSPITAL_BASED_OUTPATIENT_CLINIC_OR_DEPARTMENT_OTHER)

## 2023-06-29 ENCOUNTER — Encounter (HOSPITAL_BASED_OUTPATIENT_CLINIC_OR_DEPARTMENT_OTHER): Payer: Self-pay | Admitting: Emergency Medicine

## 2023-06-29 ENCOUNTER — Emergency Department (HOSPITAL_BASED_OUTPATIENT_CLINIC_OR_DEPARTMENT_OTHER): Admitting: Radiology

## 2023-06-29 DIAGNOSIS — X58XXXA Exposure to other specified factors, initial encounter: Secondary | ICD-10-CM | POA: Diagnosis not present

## 2023-06-29 DIAGNOSIS — S161XXA Strain of muscle, fascia and tendon at neck level, initial encounter: Secondary | ICD-10-CM | POA: Insufficient documentation

## 2023-06-29 DIAGNOSIS — R7309 Other abnormal glucose: Secondary | ICD-10-CM | POA: Insufficient documentation

## 2023-06-29 DIAGNOSIS — M436 Torticollis: Secondary | ICD-10-CM | POA: Diagnosis present

## 2023-06-29 LAB — CBG MONITORING, ED: Glucose-Capillary: 112 mg/dL — ABNORMAL HIGH (ref 70–99)

## 2023-06-29 MED ORDER — HYDROCODONE-ACETAMINOPHEN 5-325 MG PO TABS: 2.0000 | ORAL_TABLET | Freq: Four times a day (QID) | ORAL | 0 refills | Status: AC | PRN

## 2023-06-29 NOTE — Discharge Instructions (Signed)
 Take the pain medication as directed.  Work note provided to be out of work for couple weeks.  Make an appointment follow-up with your doctor.

## 2023-06-29 NOTE — ED Triage Notes (Signed)
 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 Has worsened over the past 24hrs. HX htn- did not take medication this morning. Denies CP, SOB, -N/-V/-D.

## 2023-06-29 NOTE — ED Provider Notes (Signed)
 [facility] EM[facility]ENC[facility] DEPARTMENT AT [facility] Provider Note   CSN: [id number] Arrival date & time: 06/29/23  1322     History  Chief Complaint  Patient presents with   Torticollis    Diane Garza is a 73 y.o. female.  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  Couple days after that her upper shoulder on the right side kind of along the trapezius muscle not so much that the neck but a little bit of base of the neck has become very painful.  It hurts more to look towards the right shoulder than it does to look away from it.  No real pain with movement of the right arm.  No numbness or tingling in her hand.  Patient's been using Aspercreme on it and taking Tylenol.  Patient has follow-up with her primary care doctor this week coming up.       Home Medications Prior to Admission medications   Medication Sig Start Date End Date Taking? Authorizing Provider  acetaminophen (TYLENOL) 325 MG tablet Take 650 mg by mouth every 6 (six) hours as needed for mild pain, moderate pain or headache.    [provider]  amLODipine (NORVASC) 5 MG tablet Take 1 tablet (5 mg total) by mouth daily. 05/08/14   Mikhail, Nita Sells, DO  benazepril (LOTENSIN) 40 MG tablet Take 40 mg by mouth daily.    [provider]  cyclobenzaprine (FLEXERIL) 5 MG tablet Take 1/2 to full tab up to 3 times per day as needed. Do not drink alcohol or drive while taking this 0/86/57   Particia Nearing, PA-C  gabapentin (NEURONTIN) 300 MG capsule Take 300 mg by mouth 3 (three) times daily.    [provider]  glimepiride (AMARYL) 4 MG tablet Take 4 mg by mouth daily with breakfast.    [provider]  Ibuprofen-Diphenhydramine HCl 200-25 MG CAPS Take 1 tablet by mouth daily as needed (headache).    [provider]  meloxicam (MOBIC) 7.5 MG tablet Take 1 tablet (7.5 mg total) by mouth daily. 12/23/19   Particia Nearing, PA-C  metFORMIN  (GLUCOPHAGE) 500 MG tablet Take 1,000 mg by mouth 2 (two) times daily with a meal.    [provider]      Allergies    Patient has no known allergies.    Review of Systems   Review of Systems  Constitutional:  Negative for chills and fever.  HENT:  Negative for ear pain and sore throat.   Eyes:  Negative for pain and visual disturbance.  Respiratory:  Positive for wheezing. Negative for cough and shortness of breath.   Cardiovascular:  Negative for chest pain and palpitations.  Gastrointestinal:  Negative for abdominal pain and vomiting.  Genitourinary:  Negative for dysuria and hematuria.  Musculoskeletal:  Positive for neck pain and neck stiffness. Negative for arthralgias and back pain.  Skin:  Negative for color change and rash.  Neurological:  Negative for seizures, syncope, weakness and numbness.  All other systems reviewed and are negative.   Physical Exam Updated Vital Signs BP (!) 179/88   Pulse 73   Temp 97.8 F (36.6 C) (Oral)   Resp 18   Wt 65.8 kg   SpO2 98%   BMI 23.76 kg/m  Physical Exam Vitals and nursing note reviewed.  Constitutional:      General: She is not in acute distress.    Appearance: Normal appearance. She is well-developed.  HENT:     Head: Normocephalic and atraumatic.     Mouth/Throat:     Mouth: Mucous membranes are moist.  Eyes:     Extraocular Movements: Extraocular movements intact.     Conjunctiva/sclera: Conjunctivae normal.     Pupils: Pupils are equal, round, and reactive to light.  Neck:     Comments: Patient really with tenderness kind of on top of the clavicle trapezius muscle area on the right side of the neck.  Good range of motion of the neck particularly no discomfort moving the neck towards the left side.  A little bit moving it towards the right. Cardiovascular:     Rate and Rhythm: Normal rate and regular rhythm.     Heart sounds: No murmur heard. Pulmonary:     Effort: Pulmonary effort is normal. No  respiratory distress.     Breath sounds: Normal breath sounds.  Abdominal:     Palpations: Abdomen is soft.     Tenderness: There is no abdominal tenderness.  Musculoskeletal:        General: No swelling or deformity.     Cervical back: Normal range of motion and neck supple. Tenderness present. No rigidity.     Comments: Radial pulse is 1+.  Good strength in the fingers no pain with movement at the wrist or elbow and really no pain with movement at the shoulder.  No numbness.  Sensation intact.  Skin:    General: Skin is warm and dry.     Capillary Refill: Capillary refill takes less than 2 seconds.  Neurological:     General: No focal deficit present.     Mental Status: She is alert and oriented to person, place, and time.  Psychiatric:        Mood and Affect: Mood normal.     ED Results / Procedures / Treatments   Labs (all labs ordered are listed, but only abnormal results are displayed) Labs Reviewed - No data to display  EKG None  Radiology No results found.  Procedures Procedures    Medications Ordered in ED Medications - No data to display  ED Course/ Medical Decision Making/ A&P                                 Medical Decision Making Amount and/or Complexity of Data Reviewed Radiology: ordered.   Patient not really classic for torticollis.  Is really more in the trapezius muscle that is tight she probably strained it moving the heavy patient.  However we will go ahead and get CT cervical spine and x-ray of the right shoulder.  Problems get any pain control.  Having these studies done here today will help rule out certain things.  So is possible that there could be a pinched nerve in the neck.  But certainly has not radiating down into the arms are not classical radiculopathy.   Final Clinical Impression(s) / ED Diagnoses Final diagnoses:  Cervical strain, acute, initial encounter    Rx / DC Orders ED Discharge Orders     None         Vanetta Mulders, MD 06/29/23 1656
